# Patient Record
Sex: Female | Born: 1965
Health system: Southern US, Community
[De-identification: ages and names within clinical notes are randomized; demographics above are authoritative.]

## PROBLEM LIST (undated history)

## (undated) DIAGNOSIS — K589 Irritable bowel syndrome without diarrhea: Secondary | ICD-10-CM

## (undated) DIAGNOSIS — N281 Cyst of kidney, acquired: Secondary | ICD-10-CM

## (undated) HISTORY — PX: LITHOTRIPSY: SUR834

---

## 1997-12-30 ENCOUNTER — Encounter: Admission: RE | Admit: 1997-12-30 | Discharge: 1997-12-30 | Payer: Self-pay | Admitting: Family Medicine

## 1998-01-18 ENCOUNTER — Encounter: Admission: RE | Admit: 1998-01-18 | Discharge: 1998-01-18 | Payer: Self-pay | Admitting: Family Medicine

## 1998-01-29 ENCOUNTER — Ambulatory Visit (HOSPITAL_COMMUNITY): Admission: RE | Admit: 1998-01-29 | Discharge: 1998-01-29 | Payer: Self-pay | Admitting: *Deleted

## 1998-02-10 ENCOUNTER — Encounter: Admission: RE | Admit: 1998-02-10 | Discharge: 1998-02-10 | Payer: Self-pay | Admitting: Family Medicine

## 1998-04-02 ENCOUNTER — Other Ambulatory Visit: Admission: RE | Admit: 1998-04-02 | Discharge: 1998-04-02 | Payer: Self-pay | Admitting: Obstetrics & Gynecology

## 1998-04-13 ENCOUNTER — Encounter: Admission: RE | Admit: 1998-04-13 | Discharge: 1998-04-13 | Payer: Self-pay | Admitting: Family Medicine

## 1998-07-27 ENCOUNTER — Encounter: Admission: RE | Admit: 1998-07-27 | Discharge: 1998-07-27 | Payer: Self-pay | Admitting: Family Medicine

## 1998-10-05 ENCOUNTER — Encounter: Admission: RE | Admit: 1998-10-05 | Discharge: 1998-10-05 | Payer: Self-pay | Admitting: Sports Medicine

## 1998-11-25 ENCOUNTER — Encounter: Admission: RE | Admit: 1998-11-25 | Discharge: 1998-11-25 | Payer: Self-pay | Admitting: Family Medicine

## 1998-12-23 ENCOUNTER — Encounter: Admission: RE | Admit: 1998-12-23 | Discharge: 1998-12-23 | Payer: Self-pay | Admitting: Family Medicine

## 1999-03-10 ENCOUNTER — Encounter: Admission: RE | Admit: 1999-03-10 | Discharge: 1999-03-10 | Payer: Self-pay | Admitting: Family Medicine

## 1999-04-08 ENCOUNTER — Encounter: Admission: RE | Admit: 1999-04-08 | Discharge: 1999-04-08 | Payer: Self-pay | Admitting: Family Medicine

## 1999-04-15 ENCOUNTER — Other Ambulatory Visit: Admission: RE | Admit: 1999-04-15 | Discharge: 1999-04-15 | Payer: Self-pay | Admitting: Obstetrics and Gynecology

## 1999-05-03 ENCOUNTER — Encounter: Admission: RE | Admit: 1999-05-03 | Discharge: 1999-05-03 | Payer: Self-pay | Admitting: Sports Medicine

## 1999-07-06 ENCOUNTER — Encounter: Admission: RE | Admit: 1999-07-06 | Discharge: 1999-07-06 | Payer: Self-pay | Admitting: Family Medicine

## 1999-10-05 ENCOUNTER — Encounter: Admission: RE | Admit: 1999-10-05 | Discharge: 1999-10-05 | Payer: Self-pay | Admitting: Family Medicine

## 1999-11-09 ENCOUNTER — Encounter: Admission: RE | Admit: 1999-11-09 | Discharge: 1999-11-09 | Payer: Self-pay | Admitting: Family Medicine

## 1999-12-07 ENCOUNTER — Encounter: Admission: RE | Admit: 1999-12-07 | Discharge: 1999-12-07 | Payer: Self-pay | Admitting: Family Medicine

## 2000-01-04 ENCOUNTER — Encounter: Admission: RE | Admit: 2000-01-04 | Discharge: 2000-01-04 | Payer: Self-pay | Admitting: Family Medicine

## 2000-02-02 ENCOUNTER — Encounter: Admission: RE | Admit: 2000-02-02 | Discharge: 2000-02-02 | Payer: Self-pay | Admitting: Family Medicine

## 2000-03-14 ENCOUNTER — Encounter: Admission: RE | Admit: 2000-03-14 | Discharge: 2000-03-14 | Payer: Self-pay | Admitting: Family Medicine

## 2000-04-13 ENCOUNTER — Other Ambulatory Visit: Admission: RE | Admit: 2000-04-13 | Discharge: 2000-04-13 | Payer: Self-pay | Admitting: Obstetrics & Gynecology

## 2000-04-18 ENCOUNTER — Encounter: Admission: RE | Admit: 2000-04-18 | Discharge: 2000-04-18 | Payer: Self-pay | Admitting: Family Medicine

## 2000-05-16 ENCOUNTER — Encounter: Admission: RE | Admit: 2000-05-16 | Discharge: 2000-05-16 | Payer: Self-pay | Admitting: Family Medicine

## 2000-06-12 ENCOUNTER — Encounter: Admission: RE | Admit: 2000-06-12 | Discharge: 2000-06-12 | Payer: Self-pay | Admitting: Family Medicine

## 2000-07-11 ENCOUNTER — Encounter: Admission: RE | Admit: 2000-07-11 | Discharge: 2000-07-11 | Payer: Self-pay | Admitting: Family Medicine

## 2000-08-13 ENCOUNTER — Encounter: Admission: RE | Admit: 2000-08-13 | Discharge: 2000-08-13 | Payer: Self-pay | Admitting: Family Medicine

## 2000-10-09 ENCOUNTER — Encounter: Admission: RE | Admit: 2000-10-09 | Discharge: 2000-10-09 | Payer: Self-pay | Admitting: Family Medicine

## 2001-05-24 ENCOUNTER — Encounter: Admission: RE | Admit: 2001-05-24 | Discharge: 2001-05-24 | Payer: Self-pay | Admitting: Family Medicine

## 2001-09-11 ENCOUNTER — Encounter: Admission: RE | Admit: 2001-09-11 | Discharge: 2001-09-11 | Payer: Self-pay | Admitting: Family Medicine

## 2001-09-13 ENCOUNTER — Encounter: Admission: RE | Admit: 2001-09-13 | Discharge: 2001-09-13 | Payer: Self-pay | Admitting: Sports Medicine

## 2002-03-14 ENCOUNTER — Ambulatory Visit (HOSPITAL_COMMUNITY): Admission: RE | Admit: 2002-03-14 | Discharge: 2002-03-14 | Payer: Self-pay | Admitting: Family Medicine

## 2002-03-14 ENCOUNTER — Encounter: Payer: Self-pay | Admitting: Family Medicine

## 2002-04-18 ENCOUNTER — Encounter: Admission: RE | Admit: 2002-04-18 | Discharge: 2002-04-18 | Payer: Self-pay | Admitting: Sports Medicine

## 2002-04-25 ENCOUNTER — Inpatient Hospital Stay (HOSPITAL_COMMUNITY): Admission: RE | Admit: 2002-04-25 | Discharge: 2002-04-25 | Payer: Self-pay | Admitting: Neurosurgery

## 2002-04-25 ENCOUNTER — Encounter: Payer: Self-pay | Admitting: Neurosurgery

## 2002-04-25 ENCOUNTER — Encounter (INDEPENDENT_AMBULATORY_CARE_PROVIDER_SITE_OTHER): Payer: Self-pay | Admitting: *Deleted

## 2002-07-18 ENCOUNTER — Other Ambulatory Visit: Admission: RE | Admit: 2002-07-18 | Discharge: 2002-07-18 | Payer: Self-pay | Admitting: Obstetrics and Gynecology

## 2002-09-26 ENCOUNTER — Encounter: Admission: RE | Admit: 2002-09-26 | Discharge: 2002-09-26 | Payer: Self-pay | Admitting: Sports Medicine

## 2002-10-24 ENCOUNTER — Ambulatory Visit (HOSPITAL_COMMUNITY): Admission: RE | Admit: 2002-10-24 | Discharge: 2002-10-24 | Payer: Self-pay | Admitting: Gastroenterology

## 2002-10-24 ENCOUNTER — Encounter: Payer: Self-pay | Admitting: Gastroenterology

## 2003-03-20 ENCOUNTER — Encounter: Admission: RE | Admit: 2003-03-20 | Discharge: 2003-03-20 | Payer: Self-pay | Admitting: Sports Medicine

## 2003-04-24 ENCOUNTER — Encounter: Admission: RE | Admit: 2003-04-24 | Discharge: 2003-04-24 | Payer: Self-pay | Admitting: Family Medicine

## 2003-08-07 ENCOUNTER — Other Ambulatory Visit: Admission: RE | Admit: 2003-08-07 | Discharge: 2003-08-07 | Payer: Self-pay | Admitting: *Deleted

## 2003-11-13 ENCOUNTER — Encounter: Admission: RE | Admit: 2003-11-13 | Discharge: 2003-11-13 | Payer: Self-pay | Admitting: Sports Medicine

## 2005-06-23 ENCOUNTER — Encounter: Admission: RE | Admit: 2005-06-23 | Discharge: 2005-06-23 | Payer: Self-pay | Admitting: Family Medicine

## 2006-04-27 ENCOUNTER — Ambulatory Visit: Payer: Self-pay | Admitting: Sports Medicine

## 2006-05-04 ENCOUNTER — Encounter: Admission: RE | Admit: 2006-05-04 | Discharge: 2006-05-04 | Payer: Self-pay | Admitting: Sports Medicine

## 2006-12-31 ENCOUNTER — Telehealth: Payer: Self-pay | Admitting: *Deleted

## 2007-01-01 ENCOUNTER — Encounter: Payer: Self-pay | Admitting: Sports Medicine

## 2007-01-01 ENCOUNTER — Ambulatory Visit: Payer: Self-pay | Admitting: Family Medicine

## 2007-01-01 DIAGNOSIS — M2170 Unequal limb length (acquired), unspecified site: Secondary | ICD-10-CM | POA: Insufficient documentation

## 2007-01-01 DIAGNOSIS — M25559 Pain in unspecified hip: Secondary | ICD-10-CM | POA: Insufficient documentation

## 2007-01-01 DIAGNOSIS — F5000 Anorexia nervosa, unspecified: Secondary | ICD-10-CM | POA: Insufficient documentation

## 2007-01-02 ENCOUNTER — Encounter: Admission: RE | Admit: 2007-01-02 | Discharge: 2007-01-02 | Payer: Self-pay | Admitting: Sports Medicine

## 2007-01-06 ENCOUNTER — Encounter: Admission: RE | Admit: 2007-01-06 | Discharge: 2007-01-06 | Payer: Self-pay | Admitting: Sports Medicine

## 2007-01-18 ENCOUNTER — Ambulatory Visit: Payer: Self-pay | Admitting: Sports Medicine

## 2007-01-18 DIAGNOSIS — M779 Enthesopathy, unspecified: Secondary | ICD-10-CM | POA: Insufficient documentation

## 2007-02-08 ENCOUNTER — Ambulatory Visit: Payer: Self-pay | Admitting: Sports Medicine

## 2007-03-08 ENCOUNTER — Ambulatory Visit: Payer: Self-pay | Admitting: Sports Medicine

## 2007-04-12 ENCOUNTER — Ambulatory Visit: Payer: Self-pay | Admitting: Sports Medicine

## 2007-05-01 ENCOUNTER — Encounter: Payer: Self-pay | Admitting: Sports Medicine

## 2007-06-14 ENCOUNTER — Encounter: Payer: Self-pay | Admitting: Sports Medicine

## 2007-06-28 ENCOUNTER — Ambulatory Visit: Payer: Self-pay | Admitting: Sports Medicine

## 2007-06-28 DIAGNOSIS — M84376A Stress fracture, unspecified foot, initial encounter for fracture: Secondary | ICD-10-CM | POA: Insufficient documentation

## 2007-08-02 ENCOUNTER — Ambulatory Visit: Payer: Self-pay | Admitting: Sports Medicine

## 2007-09-06 ENCOUNTER — Ambulatory Visit: Payer: Self-pay | Admitting: Sports Medicine

## 2007-10-18 ENCOUNTER — Ambulatory Visit: Payer: Self-pay | Admitting: Sports Medicine

## 2008-01-24 ENCOUNTER — Telehealth (INDEPENDENT_AMBULATORY_CARE_PROVIDER_SITE_OTHER): Payer: Self-pay | Admitting: *Deleted

## 2008-01-31 ENCOUNTER — Encounter: Admission: RE | Admit: 2008-01-31 | Discharge: 2008-01-31 | Payer: Self-pay | Admitting: Sports Medicine

## 2008-01-31 ENCOUNTER — Ambulatory Visit: Payer: Self-pay | Admitting: Sports Medicine

## 2008-01-31 DIAGNOSIS — R519 Headache, unspecified: Secondary | ICD-10-CM | POA: Insufficient documentation

## 2008-01-31 DIAGNOSIS — R51 Headache: Secondary | ICD-10-CM | POA: Insufficient documentation

## 2008-01-31 DIAGNOSIS — J320 Chronic maxillary sinusitis: Secondary | ICD-10-CM | POA: Insufficient documentation

## 2008-09-22 ENCOUNTER — Encounter: Payer: Self-pay | Admitting: Sports Medicine

## 2008-10-02 ENCOUNTER — Ambulatory Visit (HOSPITAL_COMMUNITY): Admission: RE | Admit: 2008-10-02 | Discharge: 2008-10-02 | Payer: Self-pay | Admitting: Obstetrics

## 2008-11-30 ENCOUNTER — Ambulatory Visit: Payer: Self-pay | Admitting: Sports Medicine

## 2008-11-30 DIAGNOSIS — J209 Acute bronchitis, unspecified: Secondary | ICD-10-CM | POA: Insufficient documentation

## 2008-11-30 DIAGNOSIS — R079 Chest pain, unspecified: Secondary | ICD-10-CM | POA: Insufficient documentation

## 2008-12-03 ENCOUNTER — Telehealth (INDEPENDENT_AMBULATORY_CARE_PROVIDER_SITE_OTHER): Payer: Self-pay | Admitting: *Deleted

## 2009-08-25 ENCOUNTER — Encounter: Payer: Self-pay | Admitting: Sports Medicine

## 2009-08-30 ENCOUNTER — Encounter: Payer: Self-pay | Admitting: Sports Medicine

## 2010-02-18 ENCOUNTER — Ambulatory Visit: Payer: Self-pay | Admitting: Family Medicine

## 2010-02-18 DIAGNOSIS — M79609 Pain in unspecified limb: Secondary | ICD-10-CM | POA: Insufficient documentation

## 2010-02-22 ENCOUNTER — Telehealth: Payer: Self-pay | Admitting: Family Medicine

## 2010-06-06 ENCOUNTER — Ambulatory Visit: Payer: Self-pay | Admitting: Sports Medicine

## 2010-06-06 DIAGNOSIS — M76899 Other specified enthesopathies of unspecified lower limb, excluding foot: Secondary | ICD-10-CM | POA: Insufficient documentation

## 2010-07-14 ENCOUNTER — Ambulatory Visit: Payer: Self-pay | Admitting: Sports Medicine

## 2010-08-24 ENCOUNTER — Encounter: Payer: Self-pay | Admitting: Sports Medicine

## 2010-09-18 ENCOUNTER — Encounter: Payer: Self-pay | Admitting: Sports Medicine

## 2010-09-18 ENCOUNTER — Encounter: Payer: Self-pay | Admitting: *Deleted

## 2010-09-27 NOTE — Assessment & Plan Note (Signed)
Summary: fu  left hip pain [facility]   Vital Signs:  Patient Profile:   45 Years Old Female Pulse rate:   66 / minute BP sitting:   105 / 66  Vitals Entered By: Lillia Pauls CMA (February 08, 2007 11:50 AM)               Chief Complaint:  fu  left hip pain.  History of Present Illness: Sherry Gross is 3 weeks post rest for an enthesiopathy of left Hamstring. she is doing exercises and feels stable on 2 lbs swimming daily less pain with walking now not running and prefers swimming to bike or elliptical        Physical Exam  General:     alert and underweight appearing.  pleasant female in NAD Msk:     very mild TTP at insertion of HS tendons onto ischial tuberosity on LT good strength at 90 deg of knee flexion pain at insertion at 45 deg of knee flexion for resisted HS test less pain at 20 deg of knee flexion  gait is better with minimal walking limp but slight shortening of left stride can do lunge walk without pain  unable to do a single hop on left     Impression & Recommendations:  Problem # 1:  ENTHESOPATHY, SITE NOS (ICD-726.90) continue xtrain and rehab program for next 4 to 6 wks when this is less painful and she can hop wihtout pain - return for eval if stride OK will allow easy running at that point Orders: [facility]- Est Level  3 ([address])    Patient Instructions: 1)  Perform 3 sets of 15  of the following exercises 2)  2)  1) hamstring curls using 2 lbs ankle weights 3)  3)  2) hamstring swings using 2 lbs ankle weights 4)  4)  3) hamstring lunges using 2 lbs ankle weights 5)  5)  4)Also perform half squat and up slowly using 10 lb weights  6)  6)  5)four way straight leg lifts 7)  7)  foward, lateral, reverse, and on the left side going to the right 8)  8)  6)(running lunge) using 10 pound weight 9)  9)  7)elliptical, stationary bike, swimming 10)  Lunge walking as a test of healing 11)  simple hop test as the same after lunge walking is easy 12)  Use  elliptical as a test

## 2010-09-27 NOTE — Assessment & Plan Note (Signed)
Summary: pt will arrive at 8:45 seen for headaches/dlh   Vital Signs:  Patient Profile:   45 Years Old Female Pulse rate:   71 / minute BP sitting:   100 / 68  Vitals Entered By: Lillia Pauls CMA (January 31, 2008 8:54 AM)                 Chief Complaint:  headaches.  History of Present Illness: HX of headaches Hx of sinus headaches in 2007 confirmed with CT - related to swimming took midrin but this made her too drowsy  these were occassional until 2 to 3 months and now very frequent up to 8 of 10 days with pain of 8 out of 10 several days longest duration was 3 days left sided frontal and temporal lobe and into left neck and shoulder sometimes relieved with 2 advil  not sure if related to dentistry because seh does have them on weekends  sometimes pops neck but not a lot of neck pain hx  no fam hx of migraine/ no personal hx of this type of ha  other sxs are of nasal drip - uses Afrin daily 5 to 6 times facial pressure as well particularly left    Past Medical History:    anorexia with previous inpatient Rx    anemia    oligomenorhea        Multivatmin 1 once daily    vit C 1000 mg        Severe IBS for which she uses:    Alpha lipoic acid 100 mgm    Megazyme digestive enzyme    Zelnorm 6 mgm per day - from [place]    Cascara    Swiss Chriss  Past Surgical History:    Schwanoma Rt thigh 2003 - Dr Channing Mutters      Physical Exam  General:     underweight appearing.   alert nad well oriented Head:     Normocephalic and atraumatic without obvious abnormalities. No apparent alopecia or balding. Eyes:     No corneal or conjunctival inflammation noted. EOMI. Perrla. Funduscopic exam benign, without hemorrhages, exudates or papilledema. Vision grossly normal. Ears:     External ear exam shows no significant lesions or deformities.  Otoscopic examination reveals clear canals, tympanic membranes are intact bilaterally without bulging, retraction, inflammation or  discharge. Hearing is grossly normal bilaterally. Nose:     External nasal examination shows no deformity or inflammation. Nasal mucosa are pink and moist without lesions or exudates. Mouth:     Oral mucosa and oropharynx without lesions or exudates.  Teeth in good repair. Lungs:     Normal respiratory effort, chest expands symmetrically. Lungs are clear to auscultation, no crackles or wheezes. Heart:     Normal rate and regular rhythm. S1 and S2 normal without gallop, murmur, click, rub or other extra sounds.  rate is 50 Neurologic:     No cranial nerve deficits noted. Station and gait are normal. Plantar reflexes are down-going bilaterally. DTRs are symmetrical throughout. Sensory, motor and coordinative functions appear intact.    Impression & Recommendations:  Problem # 1:  HEADACHE (ICD-784.0)  Her updated medication list for this problem includes:    Tramadol Hcl 50 Mg Tabs (Tramadol hcl) .Marland Kitchen... 1 by mouth q 6hrs  Orders: CT without Contrast (CT w/o contrast) [facility]- Est Level  3 (99213) [facility]- Est  Level 4 (98119)  will check CT of sinuses and assess difficult for her to work with the  multiple headaches trial on tramadol in interim   Problem # 2:  CHRONIC MAXILLARY SINUSITIS (ICD-473.0) CT scan shows involvement of both sinuses suspect we need to make several changes will need nasal steroid wean afrin from chronic use use prolonged abx until clear Orders: [facility]- Est  Level 4 ([address])   coordinating care after per phone as discussed with patient and will use ABX and meds after she agrees Time 40 mins  Complete Medication List: 1)  Prilosec 20 Mg Cpdr (Omeprazole) .Marland Kitchen.. 1 by mouth qd 2)  Zantac 150 Mg Caps (Ranitidine hcl) .... 2 by mouth at hs 3)  Tramadol Hcl 50 Mg Tabs (Tramadol hcl) .Marland Kitchen.. 1 by mouth q 6hrs    Prescriptions: TRAMADOL HCL 50 MG  TABS (TRAMADOL HCL) 1 by mouth q 6hrs  #40 x 3   Entered and Authorized by:   Enid Baas MD   Signed by:   Enid Baas  MD on 01/31/2008   Method used:   Print then Give to Patient   RxID:   [id number]  ]  Appended Document: Med/Alg Import    Medications Added DOXYCYCLINE HYCLATE 100 MG TABS (DOXYCYCLINE HYCLATE) Take 1 tablet by mouth twice a day NASONEX 50 MCG/ACT  SUSP (MOMETASONE FUROATE) 1 to 2 puffs each nostril qd      Prescriptions: NASONEX 50 MCG/ACT  SUSP (MOMETASONE FUROATE) 1 to 2 puffs each nostril qd  #1 x 11   Entered and Authorized by:   Enid Baas MD   Signed by:   Enid Baas MD on 02/03/2008   Method used:   Electronically sent to ...       [facility]  [place] [address]       [address] [place] [address]       [place], [place]  [address]       Ph: [phone] or [phone]       Fax: [phone]   RxID:   [id number] DOXYCYCLINE HYCLATE 100 MG TABS (DOXYCYCLINE HYCLATE) Take 1 tablet by mouth twice a day  #60 x 1   Entered and Authorized by:   Enid Baas MD   Signed by:   Enid Baas MD on 02/03/2008   Method used:   Electronically sent to ...       [facility]  [place] [address]       [address]       [place], [place]  [address]       Ph: [phone] or [phone]       Fax: [phone]   RxID:   [id number]

## 2010-09-27 NOTE — Progress Notes (Signed)
Summary: WI request  Phone Note Call from Patient Call back at [phone]   Reason for Call: Talk to Nurse Summary of Call: pt states she hurt her hip this weekend when running and Dr Darrick Penna told her to come in this week to be seen by him - he is booked - needs to be seen on Friday Initial call taken by: Haydee Salter,  Dec 31, 2006 10:41 AM  Follow-up for Phone Call        Spoke with Dr. Jonni Sanger.  She can come in on Friday but Dr. Darrick Penna is booked up that morning.  I told her to I would check with Dr. Darrick Penna to see if he's be willling to work her in.  If not I would call her back to set up another time. Follow-up by: Dennison Nancy RN,  Dec 31, 2006 11:10 AM

## 2010-09-27 NOTE — Assessment & Plan Note (Signed)
Summary: F/U  DPG    Vital Signs:  Patient Profile:   45 Years Old Female Pulse rate:   70 / minute BP sitting:   103 / 59  Vitals Entered By: Lillia Pauls CMA (October 18, 2007 8:57 AM)                 Chief Complaint:  fu left hamstring pain.  History of Present Illness: Elverda comes in for followup of her L proximal hamstring tendinosis Sx significantly improved since last visit. Diligent about strengthening exercises prescribed. Still hurts when sitting on the affected area.   Back running 6 mi 5-7x/wk. Feels as though hamstring is "as good as it's going to get." Other recent injuries also on L side include calcaneal stress fx, has not been symptomatic since prior to last appt. Unable to tolerate custom orthotics 2/2 blister formation.        Physical Exam  General:     pleasant, very thin, WF in NAD Msk:     L LEG: mildly tender at ischial tuberosity and proximal hamstring tendon, just at insertion full ROM at hips good strength bilateral hamstrings at 90 and 30 degrees no pain with resisted flexion    Impression & Recommendations:  Problem # 1:  ENTHESOPATHY, SITE NOS (ICD-726.90) Assessment: Improved Proximal hamstring teninosis.   Pain and strength improved.  -Continue strengthening exercises. -Focus on recovery nutrition to include mostly carbs and some protein to replace energy as well as help build muscle. -Given multiple overuse injuries on L side, suspect leg length discrepancy playing a role.  Strongly suggested trying again to tolerate custom orthotics that incorporate leg length correction.  Gave advice regarding blister management. -Followup as needed. Orders: [facility]- Est Level  3 ([address])      ]

## 2010-09-27 NOTE — Assessment & Plan Note (Signed)
Summary: 9:00-HEEL SWELLING,[facility]   Vital Signs:  Patient profile:   45 year old female BP sitting:   109 / 74  Vitals Entered By: Lillia Pauls CMA (February 18, 2010 9:07 AM)  History of Present Illness: 4-5 days of steadily worsening posterior left heel pain. Hurts to walk or stand.  No specific injury---3 weeks ago did get new running shoes, Has used ice with little relief  Runs 30-40 miles per week--this is unchanged over mny years  PERTINENT PMH/PSH: Hx left heel stress fx hx prior problems with retreocalcaneal bursitis--she cannot remember which foot. 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   Msk:  Left posterio heel is warm, no erythema. TTP over bursa. Achilles is nontender and has no defect.  pulses 2+B= dorsalis pedis  sensation to soft touch B intact in feet.  Gait is antalgic Additional Exam:  [place] left posterior heel. Calcaneus bone itself looksnormal although there is some evidence of osteophytes on rim. The retricalcaneal bursa is well defined, very enlarged, no increase in vascularity is seen on doppler. Achilles tendon is normal in appearance and width.   Impression & Recommendations:  Problem # 1:  HEEL PAIN (ICD-729.5)  Orders: Cam Walker [phone]) [place] LIMITED [phone]) fairly severe retrocalcaneal bursitis air cast cam walker modified to supprt laterally without putting pressure on the posterior heel. Also 3/4 heel lift placed in cam walker will have her wear cam walker while walking or standing, no running. Ok swim. She does not like t bike. She will touch base with me on Monday and we can see how she is doing. Bursais quite inflamed at this point. I would want some improvement before I took her out  of boot much. She is Ok with this plan. Ice baths and voltaren gel  Complete Medication List: 1)  Prilosec 20 Mg Cpdr (Omeprazole) .Marland Kitchen.. 1 by mouth qd 2)  Zantac 150 Mg Caps  (Ranitidine hcl) .... 2 by mouth at hs 3)  Spironolactone 25 Mg Tabs (Spironolactone) .Marland Kitchen.. 1 by mouth bid 4)  Zithromax Z-pak 250 Mg Tabs (Azithromycin) .... Use as directed 5)  Voltaren 1 % Gel (Diclofenac sodium) .... Apply 2-4 g qid to left heel   disp 100 g tube Prescriptions: VOLTAREN 1 % GEL (DICLOFENAC SODIUM) apply 2-4 g qid to left heel   disp 100 g tube  #1 x 2   Entered and Authorized by:   Denny Levy MD   Signed by:   Denny Levy MD on 02/18/2010   Method used:   [facility]        [facility]  [place] [address] [facility]       [address] [place] [address]       [place], [place]  [address]       Ph: [phone] or [phone]       Fax: [phone]   RxID:   [id number]

## 2010-09-27 NOTE — Assessment & Plan Note (Signed)
Summary: HIP INJURY,[facility]   Vital Signs:  Patient profile:   45 year old female Pulse rate:   72 / minute BP sitting:   112 / 75  (right arm)  Vitals Entered By: Rochele Pages RN (June 06, 2010 12:04 PM) CC: lt hip pain x2 months   CC:  lt hip pain x2 months.  History of Present Illness: Patient presents to clinic for evaluation of Lt hip pain which she has been experiencing for the past 2 months.  She states the pain is deep within her left hip, and this morning the pain actually radiated down the back of her thigh for the first time.   She states the pain is similar to an old injury she experienced.   She does straight leg raises laterally, to the front and back wearing an 8lb weight- reps of 30. States she has done these daily for the past 2 years since her old injury.  Runs approximately 50 miles per week.  In 2008 she had partial tear of left HS tendons insertion by MRI This healed slowly w exercise  Physical Exam  General:  Well-developed,well-nourished,in no acute distress; alert,appropriate and cooperative throughout examination  thin Msk:  Pain at ischial tuberosity left HS insertion some pain in buttocks at piriformis but less good strength on hip abduction, adduction. rotation and extension norm hip ROM  resisted HS strength is dec 2/2 pain? this is in eccentric testing primarily Additional Exam:  MSK [place] calcifiction at HS tendon insertion to left tibial tuberosity numerous neovessels and inc doppler flow noted in this region large bursal swelling over tuberosity   Impression & Recommendations:  Problem # 1:  HIP PAIN (ICD-719.45)  Orders: Garment,belt,sleeve or other covering ,elastic or similar stretch (G2542) [place] LIMITED ([id number])   start back rehab exercises uses HS sleeve as much as comfortable  Problem # 2:  ENTHESOPATHY, SITE NOS (ICD-726.90)  would like to avoid cortisone w partial tear in 2008  will try NTG protocol  Orders: [place] LIMITED  ([id number])  Problem # 3:  BURSITIS, HIP (ICD-726.5)  May need to do directed [place] injection of this if pain is not improved for now cushion for seat Icing  reck 4 to 6 wks  Orders: [place] LIMITED ([address])  Complete Medication List: 1)  Prilosec 20 Mg Cpdr (Omeprazole) .Marland Kitchen.. 1 by mouth qd 2)  Zantac 150 Mg Caps (Ranitidine hcl) .... 2 by mouth at hs 3)  Spironolactone 25 Mg Tabs (Spironolactone) .Marland Kitchen.. 1 by mouth bid 4)  Zithromax Z-pak 250 Mg Tabs (Azithromycin) .... Use as directed 5)  Voltaren 1 % Gel (Diclofenac sodium) .... Apply 2-4 g qid to left heel   disp 100 g tube 6)  Nitroglycerin 0.2 Mg/hr Pt24 (Nitroglycerin) .... 1/4 patch to tendon as directed for 24 hours per day Prescriptions: NITROGLYCERIN 0.2 MG/HR PT24 (NITROGLYCERIN) 1/4 patch to tendon as directed for 24 hours per day  #30 x 1   Entered and Authorized by:   Enid Baas MD   Signed by:   Enid Baas MD on 06/06/2010   Method used:   [facility]        [facility]  [place] [address]       [address] [place] [address]       [place], [place]  [address]       Ph: [phone] or [phone]       Fax: [phone]   RxID:   [id number]

## 2010-09-27 NOTE — Assessment & Plan Note (Signed)
Summary: FOLLOW UP MRI OF HIP   Vital Signs:  Patient Profile:   45 Years Old Female Pulse rate:   60 / minute BP sitting:   104 / 64  Vitals Entered By: Lillia Pauls CMA (Jan 18, 2007 10:06 AM)               Chief Complaint:  fu mri.  History of Present Illness: Still reports pain at the inferior crease of her left gluteus.  Still has pain with hip extension and knee flexion.    Denies pain in the right lower back.    Mri report shows stress reaction at the left ishial tuberosity and partial tear of the semimembranosus insertion there.  Also shows stress reaction in the right superior sacrum.          Physical Exam  General:     Back shows mild scoliosis Leg length reveals left  ~ 1 cm longer than Rt Patient has pain with resisted knee flexion.  Pain is located in the middle body of the left hamstring.  Has improving ROM in her left hip with flexion to 90 degrees and extension to 15 degrees.  strength is good today on RT and mildly decreased on LT with some giving way second to pain  no defects noted in HS belly        Impression & Recommendations:  Problem # 1:  HIP PAIN (ICD-719.45) Assessment: Improved Likely due to semimembranosus tendonopathy and ischial stress reaction.  Will begin physical therapy as outlined in the instruction section.  Will likely need 6 weeks of rest from running to rehab the stress reaction.  This is significantly improved after 2 weeks of limited running and more crosstraining Orders: [facility]- Est Level  3 ([address])   Problem # 2:  ANOREXIA NERVOSA (ICD-307.1) Assessment: Unchanged Stable under team treatment Orders: [facility]- Est Level  3 (60454)   Problem # 3:  ENTHESOPATHY, SITE NOS (ICD-726.90) will treat as stress frx and wait a full 6 weeks to run 2 wks on TM and then progress to running  Orders: [facility]- Est Level  3 ([address]11)    Patient Instructions: 1)  Perform 3 sets of 15  of the following exercises 2)  1) hamstring  curls using 2 lbs ankle weights 3)  2) hamstring swings using 2 lbs ankle weights 4)  3) hamstring lunges using 2 lbs ankle weights 5)  4)Also perform half squat and up slowly using 10 lb weights  6)  5)four way straight leg lifts 7)  foward, lateral, reverse, and on the left side going to the right 8)  6)(running lunge) using 10 pound weight 9)  7)elliptical, stationary bike, swimming
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
## 2010-09-27 NOTE — Assessment & Plan Note (Signed)
Summary: 4:15 APPT,F/U HIP,[facility]   Vital Signs:  Patient profile:   45 year old female Pulse rate:   61 / minute BP sitting:   107 / 73  (right arm)  Vitals Entered By: Rochele Pages RN (July 14, 2010 4:55 PM)  History of Present Illness: Mahitha returns for injury left HS pain now over buttocks at times sharp w sitting in wrong chair no sciatic sxs doing exercises able to run 40 mpw only mild pain in HS  Physical Exam  General:   Thin - in no acute distress; alert,appropriate and cooperative throughout examination Msk:  mild TT deep palpation of left buttocks no TTP ischial tuberosity HS better overall strength still some weakness on isolating semimembranosus and tendinosis  good hip abduction and rotation stretngth  walking and jogging gaits without limp   Impression & Recommendations:  Problem # 1:  BURSITIS, HIP (ICD-726.5) less tender suspect bursal swelling is less if not resoved in 6 wks reck and scan  Problem # 2:  ENTHESOPATHY, SITE NOS (ICD-726.90) cont series of HS exercises  add some overstriding drills  cont to use HS comp sleeve indefinitely  Complete Medication List: 1)  Prilosec 20 Mg Cpdr (Omeprazole) .Marland Kitchen.. 1 by mouth qd 2)  Zantac 150 Mg Caps (Ranitidine hcl) .... 2 by mouth at hs 3)  Spironolactone 25 Mg Tabs (Spironolactone) .Marland Kitchen.. 1 by mouth bid 4)  Zithromax Z-pak 250 Mg Tabs (Azithromycin) .... Use as directed 5)  Voltaren 1 % Gel (Diclofenac sodium) .... Apply 2-4 g qid to left heel   disp 100 g tube 6)  Nitroglycerin 0.2 Mg/hr Pt24 (Nitroglycerin) .... 1/4 patch to tendon as directed for 24 hours per day   Orders Added: 1)  Est. Patient Level III [[address]109]

## 2010-09-27 NOTE — Progress Notes (Signed)
Summary: please call patient today   Phone Note Call from Patient Call back at Home Phone [phone]   Caller: Patient Summary of Call: Pt would like call today and can be reached at [phone] - regards to phone call from Dr Darrick Penna.  It has been over 1 week, to fit patient in for headaches.   Initial call taken by: Rae Roam,  Jan 24, 2008 2:39 PM  Follow-up for Phone Call        pt will see fields at given appt Follow-up by: Lillia Pauls CMA,  Jan 24, 2008 3:44 PM

## 2010-09-27 NOTE — Assessment & Plan Note (Signed)
Summary: TO SEE [name]/KH   Vital Signs:  Patient Profile:   45 Years Old Female BP sitting:   106 / 66  Pt. in pain?   yes    Location:   hip    Intensity:   5                Chief Complaint:  left hip pain.  History of Present Illness: February started with outside LT hip pain more over greater troch mileage was 35 per wk mostely treadmill running no changes before onset made her limp a  little but did not make her change  9 days ago after 5K made symptoms worse - still on outside  Half marathon this week made much worse and into groin, pelvis and buttocks LT leg felt numb after race from thigh down no back pain no trauma  Bone desnity report shows osteopenia with range of - 1.2 to -1.6 at hips  Unable to tolerate supplemental calcium but drinks OJ with Ca No supplemental vit D  Has Problems with GI systems - getting Zelnorm from [place] - now from [facility] - danielle meyer Pa and dr rengel;  Megazyme and alpha lipoic acid Prilosec and zantac  Regular menses past 5 months/  highly irregular past years and sometimes only 1 time per year    Past Medical History:    anorexia with previous inpatient Rx    anemia    oligomenorhea   Family History:    [name] is age 29 and doing well        [name] - husband is runner; cardiac workup recently Dr. [name] unremarkable - per dr Caryl Never  Social History:    Occupation: dentist    Married    Never Smoked   Risk Factors:  Tobacco use:  never    Physical Exam  General:     underweight appearing.  markedly thin.  alert and in NAD.   Msk:     Back shows mild scoliosis  Leg length reveals left  ~ 1 cm longer than Rt  Rt hip ROM shows 45 ER and 35 IR Lt hip ROM 15 IR then pain; 30 ER then pain that refers to buttocks and greater troch  flexion limited on lt by 15 to 20 deg strength good throughout    Impression & Recommendations:  Problem # 1:  HIP PAIN (ICD-719.45) Assessment: New Lt hip must r/o stress  fracture although piriformis syndrome remains possible with positive exam and sciatica symptoms AP pelvis and LT hip detail If negative go ahead with MRI no running pending dx Orders: [facility]- Est  Level 4 (16109)   Problem # 2:  LEG LEGNTH DISCREPANCY (ICD-736.81) Assessment: Unchanged we need to return to use of orthotics and use of leg length adjustment Orders: [facility]- Est  Level 4 (99214)   Problem # 3:  ANOREXIA NERVOSA (ICD-307.1) Assessment: Unchanged critical to remain in treatment program needs Vit D level known osteopenia - need approach to building more bone density periods better this year Orders: [facility]- Est  Level 4 (60454)       Vital Signs:  Patient Profile:   45 Years Old Female BP sitting:   106 / 66    Location:   hip    Intensity:   5                Appended Document: Orders Update Please schedule MRI of hip and pelvis - r/o femoral or pelvic area stress fx;  hip pain persistent for months.  hx of anorexia. r/o piriformis syndrome   Clinical Lists Changes  Orders: Added new Test order of MRI (MRI) - Signed

## 2010-09-27 NOTE — Assessment & Plan Note (Signed)
Summary: F/U VISIT/[facility]    Vital Signs:  Patient Profile:   45 Years Old Female Pulse rate:   56 / minute BP sitting:   105 / 57  Vitals Entered By: Lillia Pauls CMA (September 06, 2007 11:27 AM)                 Chief Complaint:  left hamstring pain x may.  History of Present Illness: 45yr old thin WF with h/o L tendiosis and likely partial tear of hamstring insertion who was in the process of rehabing it when she developed a L calcaneal stress fracture.  That has been healing and she recently started running every other day 6 miles Dec 22nd, along with swimming and eliptical.  She reports continued pain in her L hip unchanged since the original injury in May.  She reports doing daily strengthening exercises. Pain 3/10 when running but much more when sitting as she is a Education officer, community and sits all day.    Original MRI showed tendinosis and likely partial tearing at the origin of the left  hamstring musculature, particularly the semimembranosus, with associated reactive edema in the left ischial tuberosity.      Past Medical History:    Reviewed history from 01/01/2007 and no changes required:       anorexia with previous inpatient Rx       anemia       oligomenorhea   Family History:    [name] is age 18 and doing well        [name] - husband is runner; cardiac workup recently Dr. [name] unremarkable - per dr Caryl Never  Social History:    Reviewed history from 01/01/2007 and no changes required:       Occupation: dentist       Married       Never Smoked     Physical Exam  General:     alert.  very thin Head:     Normocephalic and atraumatic without obvious abnormalities. No apparent alopecia or balding. Msk:     L Hip: TTP over L hamstrip insertion of isch spine.  5/5 strength at knee flexion of 90 degree but diminished at 45degree and less. Decreased strength of Abduction but normal adduction strength on L. Normal hip flexion strength. Normal sensation.  Neg tinnels.  Normal R hip strength by comparison of same exam.  L ankle: No edema. Normal ROM. Normal strength dorsiflexion, plantarflexion, eversion, inversion. No ttp of calcaneus.  Neurologic:     alert & oriented X3 and gait normal, including with jogging.     Impression & Recommendations:  Problem # 1:  ENTHESOPATHY, SITE NOS (ICD-726.90) Assessment: Deteriorated Pt continues with pain parrticularly with sitting and now also with weakness. MRI results as in HPI.  Strengthening exercises as in instructions. FU 6 weeks.   Orders: [facility]- Est  Level 4 (56213)   Problem # 2:  STRESS FRACTURE, FOOT (ICD-733.94) Assessment: Improved Pt now pain free. Still, with h/o anorexia, will keep this in mind while rehab L hamstring. I.e. no hopping exercises.  Orders: [facility]- Est  Level 4 (08657)    Patient Instructions: 1)  Follow up with Dr Darrick Penna in 6 weeks. 2)  Ok to walk/run but careful when your form starts to break down.  3)  Do the following exercises as Dr Darrick Penna discussed (3 sets of 10): 4)  - Cone touches 5)  - 10lbs eccentric leg drops 6)  - On your stomach, hip extension with leg  flexion eccentric exercises 7)  - Lunges 8)  - Abduction (or lateral leg raises) exercises    ]

## 2010-09-27 NOTE — Assessment & Plan Note (Signed)
Summary: fu/el   Vital Signs:  Patient Profile:   45 Years Old Female Pulse rate:   79 / minute BP sitting:   108 / 70  Vitals Entered By: Lillia Pauls CMA (March 08, 2007 10:52 AM)               Chief Complaint:    fu  left hip pain.  History of Present Illness: Maysa returns and follow-up of her hamstring injury she has faithfully done the exercises she can tell a lot more strength she has not run she continues swimming and crosstraining        Physical Exam  General:     underweight appearing.  NAD Head:     Normocephalic and atraumatic without obvious abnormalities. No apparent alopecia or balding. Msk:     hamstring strength today seems almost equal  left versus right tenderness at the insertion of a hamstring into the ischial tuberosity on lt running gait reveals minimal l limp but she does favor her left side    Impression & Recommendations:  Problem # 1:  ENTHESOPATHY, SITE NOS (ICD-726.90) Assessment: Improved continued her hamstring exercises add gentle stretching begin a gradual running program for days a week starting at 20 minutes and adding five minutes per week walk run if any limp or tenderness  recheck when she reaches 40 minutes of running keep up the good protein intake Orders: [facility]- Est Level  3 (16109)

## 2010-09-27 NOTE — Assessment & Plan Note (Signed)
Summary: follow up/stress fracture/el    Vital Signs:  Patient Profile:   45 Years Old Female Pulse rate:   65 / minute BP sitting:   105 / 69  Vitals Entered By: Lillia Pauls CMA (June 28, 2007 9:03 AM)                 Chief Complaint:  FU/STRESS FX.  History of Present Illness: patient is now 2 weeks post injury to left heel. seen initially with question of arch and PF injury. then much more severe and post heel - felt to be calcaneal stress fx. calcaneal view does show some stress reaction in 2 areas of calcaneus.  Patient has a hx of a lot of running injuries to left leg. just resolved HS tear recently remote stress fx of MT hx of anorexia compensated but still difficult  at time of injury training for half marathon and mileage 35 per week pain came on standing in dentist offce but 1 weekprior heel had been struck with iron gate        Physical Exam  General:     underweight appearing.   NAD pleasant Head:     Normocephalic and atraumatic without obvious abnormalities. No apparent alopecia or balding. Msk:     marked tenderness over post heel on left pseudo leg length change secondary to ant tilt of left pelvis no swellin  does have moderate abductor weakness (inspite of exercise regimen); this is bilat.    Impression & Recommendations:  Problem # 1:  STRESS FRACTURE, FOOT (ICD-733.94) Assessment: New must wear comfortable cushion xtrain in pool/ bike and can try elliptical calorie intake increase by 100 per day re ck 3 to 4 weeks Orders: [facility]- Est Level  3 (16109)   Problem # 2:  ANOREXIA NERVOSA (ICD-307.1) Assessment: Unchanged discussed energy needs Orders: [facility]- Est Level  3 (60454)   Problem # 3:  LEG LEGNTH DISCREPANCY (ICD-736.81) continue with orthotics and cushion Orders: [facility]- Est Level  3 (09811)      ]

## 2010-09-27 NOTE — Assessment & Plan Note (Signed)
Summary: FU/KH    Vital Signs:  Patient Profile:   45 Years Old Female Pulse rate:   62 / minute BP sitting:   108 / 66  Vitals Entered By: NEETON                 Chief Complaint:  L GROIN PAIN X NOV 14 AND L TOP OF FOOT PAIN X 2 WKS.  History of Present Illness: patient returns for follow up of calcaneal stress fx no 7 weeks and is much less tender was stickling to elliptical however on Nov 14 was traveling hotel had TM so she ran 5 mi severe left groin pain after this now better but still sore heel is better but has been staying on elliptical or swimming  left Hamstring enthesiopathy is doing better but still aches when she does too much  doing her exercises for leg strength  not taking calcium or vit d/ does get multivit Has GI probs eating disorder issues        Physical Exam  General:     alert.  very thin Head:     Normocephalic and atraumatic without obvious abnormalities. No apparent alopecia or balding. Msk:     slight tenderness at insertion of HS on left to ischial tuberosity strengrth now good on abduction, HS concentric and eccentric testing  left adductor is tender to testing/ not that weak but antalgic lt hip ROM nl 95 deg on rotation and good flecion hip loading does not bring out pain Extremities:     calcaneal squeeze test is slightly tender on left on medial and lateral side    Impression & Recommendations:  Problem # 1:  STRESS FRACTURE, FOOT (ICD-733.94) Assessment: Improved I think this is healing but still needs a few weeks really good to get more calcium intake Orders: [facility]- Est Level  3 (16109)   Problem # 2:  HIP PAIN (ICD-719.45) Assessment: New left seems more like adductor strain i do worry about stress fx with hx of anorexia and prior stress fractures will follow closely Orders: [facility]- Est Level  3 (60454)   Problem # 3:  ENTHESOPATHY, SITE NOS (ICD-726.90) Assessment: Improved lt hamstring definitely doing  better Orders: [facility]- Est Level  3 (09811)    Patient Instructions: 1)  keep up exercises 2)  vanilla viactin 3)  2 more weeks of pure elliptical 4)  2 weeks of alternating TM and elliptical 5)  re ck in 4 weeks    ]

## 2010-09-27 NOTE — Consult Note (Signed)
Summary: [facility]   Imported By: Alphonzo Lemmings KIVETT 06/18/2007 13:50:52  _____________________________________________________________________  External Attachment:    Type:   Image     Comment:   External Document

## 2010-09-27 NOTE — Consult Note (Signed)
Summary: [facility] Consultation Note  [facility] Consultation Note   Imported By: Haydee Salter 05/02/2007 14:26:53  _____________________________________________________________________  External Attachment:    Type:   Image     Comment:   External Document

## 2010-09-27 NOTE — Assessment & Plan Note (Signed)
Summary: fu/kh    Vital Signs:  Patient Profile:   45 Years Old Female Pulse rate:   71 / minute BP sitting:   107 / 73  Vitals Entered By: Lillia Pauls CMA (April 12, 2007 11:58 AM)               Chief Complaint:  FU L HIP PAIN.  History of Present Illness: 45 year old female here for hip f/u.  Reports that at times her hip is uncomfortable at times especially sitting for long periods of time.  Not having pain on running.   She is running four days a week and abt six miles each day on the run. Has had 6/10 pain on sitting and 2/10 on running.  Continues to do the hip exercises that were prescribed.  Feels that her pain is overall better than it was in her last visit.        Physical Exam  General:     underweight appearing.  NAD Head:     Normocephalic and atraumatic without obvious abnormalities. No apparent alopecia or balding. Msk:     hamstring strength today seems almost equal  left versus right tenderness at the insertion of a hamstring into the ischial tuberosity on the right leg and the left seems to be none tender. internal and external rotation of the hip seems to be wnl. Leg lengths unqual, left leg is shorter than the right. Hop test with a short distance b/l, equal distance some weakness still present.    Impression & Recommendations:  Problem # 1:  ENTHESOPATHY, SITE NOS (ICD-726.90) Assessment: Improved Pt has improved with her pain level. She is to continue her current exercise program from before She can increase her mileage gradually as tolerated but no big increases in mileage. Follow-up on as needed basis  Orders: [facility]- Est Level  3 (13086)   Problem # 2:  LEG LEGNTH DISCREPANCY (ICD-736.81)  Orders: [facility]- Est Level  3 (57846)  try to work into some use of orthotics blisters have been problem to date

## 2010-09-27 NOTE — Progress Notes (Signed)
----   Converted from flag ---- ---- 02/22/2010 1:44 PM, Marily Memos wrote: Pt states you asked her to call regarding her heel.  She said it's better, and is wondering if the boot can come off.  PT Contact [phone], [phone] cell. ------------------------------  spoke w Dr Cosens--80% better. Will OK coming out of boot in evening--maybe wear at work next couple of days--will let her try running some and see how it goes--with any increase in pain she returns to boot for a period. She is comfortable w this plan

## 2010-09-27 NOTE — Consult Note (Signed)
Summary: [facility]- Office Note  [facility]- Office Note   Imported By: Clydell Hakim 08/26/2009 11:26:08  _____________________________________________________________________  External Attachment:    Type:   Image     Comment:   External Document

## 2010-11-11 ENCOUNTER — Other Ambulatory Visit (HOSPITAL_COMMUNITY): Payer: Self-pay | Admitting: Obstetrics

## 2010-11-11 DIAGNOSIS — M8580 Other specified disorders of bone density and structure, unspecified site: Secondary | ICD-10-CM

## 2010-11-11 DIAGNOSIS — Z1231 Encounter for screening mammogram for malignant neoplasm of breast: Secondary | ICD-10-CM

## 2010-11-25 ENCOUNTER — Ambulatory Visit (HOSPITAL_COMMUNITY)
Admission: RE | Admit: 2010-11-25 | Discharge: 2010-11-25 | Disposition: A | Discharge status: Home / Self Care | Payer: BC Managed Care – PPO | Source: Ambulatory Visit | Attending: Obstetrics | Admitting: Obstetrics

## 2010-11-25 DIAGNOSIS — Z1231 Encounter for screening mammogram for malignant neoplasm of breast: Secondary | ICD-10-CM | POA: Insufficient documentation

## 2010-11-25 DIAGNOSIS — Z13820 Encounter for screening for osteoporosis: Secondary | ICD-10-CM | POA: Insufficient documentation

## 2010-11-25 DIAGNOSIS — M8580 Other specified disorders of bone density and structure, unspecified site: Secondary | ICD-10-CM

## 2011-01-13 NOTE — Op Note (Signed)
   NAME:  Sherry Gross, Sherry Gross                        ACCOUNT NO.:  [id number]   MEDICAL RECORD NO.:  [id number]                   PATIENT TYPE:  INP   LOCATION:  3013                                 FACILITY:  [facility]   PHYSICIAN:  Payton Doughty, M.D.                   DATE OF BIRTH:  [birth year]   DATE OF PROCEDURE:  04/25/2002  DATE OF DISCHARGE:  04/25/2002                                 OPERATIVE REPORT   OPERATION:  Resection of right thigh mass.   SURGEON:  Payton Doughty, M.D.   ANESTHESIA:  General LMA.   COMPLICATIONS:  None.   ASSISTANT:  None.   PROCEDURE:  This is a 45 year old, right-handed white girl with a palpable  mass in her right thigh.  She was taken to the operating room and had LMA  anesthesia induced.  Following this, shaved, prepped and draped in the usual  sterile fashion.  The skin was infiltrated with 1% lidocaine with 1:400,000  epinephrine, and the skin was incised for approximately 3 cm directly over  the palpable mass.  The fascial plane was obtained, and the vastus medialis  fascia was divided.  Muscle-splitting retraction was undertaken, and  immediately obvious was a mass which was densely encapsulated.  It was  easily dissected free from the surrounding muscle.  There was no attachment  of this mass to any nerves.  It was resected in toto and inspection of the  bed revealed no mass remaining.  It was passed off as a single specimen.  One attachment point was also sent as specimen.  Meticulous hemostasis was  assured.  Very little muscle cutting actually was undertaken to resect this;  it was virtually all muscle splitting.  The wound was irrigated, hemostasis  was assured.  Muscle fascia was reapproximated with 3-0 Vicryl in  interrupted fashion. Subcutaneous tissue was reapproximated with 3-0 Vicryl  in interrupted fashion.  The skin was closed with 4-0 Vicryl in a running  subcuticular fashion.  Benzoin and Steri-Strips were placed and made  occlusive  with Telfa and OpSite.  The patient then returned to the recovery  room in good condition.                                               Payton Doughty, M.D.    MWR/MEDQ  D:  04/25/2002  T:  04/29/2002  Job:  [id number]

## 2011-01-13 NOTE — H&P (Signed)
NAME:  Sherry Gross, Sherry Gross                        ACCOUNT NO.:  [id number]   MEDICAL RECORD NO.:  [id number]                   PATIENT TYPE:  INP   LOCATION:  3013                                 FACILITY:  [facility]   PHYSICIAN:  Payton Doughty, M.D.                   DATE OF BIRTH:  [birth year]   DATE OF ADMISSION:  04/25/2002  DATE OF DISCHARGE:  04/25/2002                                HISTORY & PHYSICAL   ADMISSION DIAGNOSIS:  Probable neurofibroma in the right leg.   HISTORY OF PRESENT ILLNESS:  The patient is a 45 year old right handed white  female who in November had leg pain. Did not think much about it. It seems  to resolve. Has noticed it for about four months now, a lump in her right  thigh about mid way between the inguinal ligament. Her knee on the medial  aspect is a bit tender and sartorius episodically. MRI demonstrated this and  she has come to me for evaluation.   PAST MEDICAL HISTORY:  Remarkable for irritable bowel syndrome and she has  also had one kidney stone.   MEDICATIONS:  1. Murelax 75 mg per day.  2. Colace 100 mg twice a day.  3. Lipuric acid 100 mg twice a day.  4. Dexloxiglumide (which is a study dose) three times a day.  5. Woman's vitamin.  6. Lexapro 5 mg per day for irritable bowel syndrome.   ALLERGIES:  PENICILLIN, CEPHALOSPORINS, AND IVP DYE.   FAMILY HISTORY:  Father is deceased of nasopharyngeal carcinoma. Mother is  71 years of ago and in good health with colitis.   SOCIAL HISTORY:  She does not smoke or drink. 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 She is an avid runner.   REVIEW OF SYSTEMS:  Remarkable for indigestion, abdominal pain, and leg  pain.   PHYSICAL EXAMINATION:  HEENT: Examination is within normal limits.  NECK: Reasonable range of motion.  CHEST: Clear.  CARDIAC: Regular rate and rhythm with functional bradycardia.  ABDOMEN: Nontender with no hepatosplenomegaly.  EXTREMITIES: No clubbing, cyanosis, or  edema. Peripheral pulses are good.  The mass is located on the medial aspect of the right thigh, just under the  edge of the sartorius muscle but appears somewhat deep to it. On the MRI, it  appears to be located between the vastus medialis and vastus intermedius  muscle.  GU: Examination deferred.  NEURO: Awake, alert, and oriented. Cranial nerves are intact. Motor  examination 5/5 strength throughout the upper and lower extremities. She  does not describe a sensory deficit.   IMPRESSION:  Probable neurofibroma that needs to be resected.   PLAN:  I plan on doing the procedure. The risks and benefits of this  approach have been discussed with her and she wishes to proceed.  Payton Doughty, M.D.    MWR/MEDQ  D:  04/25/2002  T:  04/28/2002  Job:  [id number]

## 2011-01-24 ENCOUNTER — Other Ambulatory Visit: Payer: Self-pay | Admitting: Family Medicine

## 2011-01-24 DIAGNOSIS — M545 Low back pain, unspecified: Secondary | ICD-10-CM

## 2011-01-28 ENCOUNTER — Ambulatory Visit
Admission: RE | Admit: 2011-01-28 | Discharge: 2011-01-28 | Disposition: A | Discharge status: Home / Self Care | Payer: BC Managed Care – PPO | Source: Ambulatory Visit | Attending: Family Medicine | Admitting: Family Medicine

## 2011-01-28 DIAGNOSIS — M545 Low back pain, unspecified: Secondary | ICD-10-CM

## 2011-05-16 ENCOUNTER — Other Ambulatory Visit: Payer: Self-pay | Admitting: Family Medicine

## 2011-05-16 DIAGNOSIS — M25561 Pain in right knee: Secondary | ICD-10-CM

## 2011-05-20 ENCOUNTER — Ambulatory Visit
Admission: RE | Admit: 2011-05-20 | Discharge: 2011-05-20 | Disposition: A | Discharge status: Home / Self Care | Payer: BC Managed Care – PPO | Source: Ambulatory Visit | Attending: Family Medicine | Admitting: Family Medicine

## 2011-05-20 DIAGNOSIS — M25561 Pain in right knee: Secondary | ICD-10-CM

## 2011-05-20 MED ORDER — GADOBENATE DIMEGLUMINE 529 MG/ML IV SOLN
10.0000 mL | Freq: Once | INTRAVENOUS | Status: AC | PRN
  Administered 2011-05-20: 10 mL via INTRAVENOUS

## 2011-10-13 ENCOUNTER — Other Ambulatory Visit (HOSPITAL_COMMUNITY): Payer: Self-pay | Admitting: Obstetrics

## 2011-10-13 DIAGNOSIS — Z1231 Encounter for screening mammogram for malignant neoplasm of breast: Secondary | ICD-10-CM

## 2011-11-10 ENCOUNTER — Ambulatory Visit (HOSPITAL_COMMUNITY)
Admission: RE | Admit: 2011-11-10 | Discharge: 2011-11-10 | Disposition: A | Discharge status: Home / Self Care | Payer: BC Managed Care – PPO | Source: Ambulatory Visit | Attending: Obstetrics | Admitting: Obstetrics

## 2011-11-10 DIAGNOSIS — Z1231 Encounter for screening mammogram for malignant neoplasm of breast: Secondary | ICD-10-CM | POA: Insufficient documentation

## 2012-05-24 ENCOUNTER — Other Ambulatory Visit: Payer: Self-pay | Admitting: Family Medicine

## 2012-05-24 DIAGNOSIS — M542 Cervicalgia: Secondary | ICD-10-CM

## 2012-05-26 ENCOUNTER — Other Ambulatory Visit: Payer: BC Managed Care – PPO

## 2012-05-26 ENCOUNTER — Inpatient Hospital Stay: Admission: RE | Admit: 2012-05-26 | Payer: BC Managed Care – PPO | Source: Ambulatory Visit

## 2012-05-30 ENCOUNTER — Other Ambulatory Visit: Payer: Self-pay | Admitting: Family Medicine

## 2012-05-30 DIAGNOSIS — M549 Dorsalgia, unspecified: Secondary | ICD-10-CM

## 2012-05-30 DIAGNOSIS — M542 Cervicalgia: Secondary | ICD-10-CM

## 2012-05-31 ENCOUNTER — Ambulatory Visit
Admission: RE | Admit: 2012-05-31 | Discharge: 2012-05-31 | Disposition: A | Discharge status: Home / Self Care | Payer: BC Managed Care – PPO | Source: Ambulatory Visit | Attending: Family Medicine | Admitting: Family Medicine

## 2012-05-31 ENCOUNTER — Other Ambulatory Visit: Payer: BC Managed Care – PPO

## 2012-05-31 DIAGNOSIS — M549 Dorsalgia, unspecified: Secondary | ICD-10-CM

## 2012-05-31 DIAGNOSIS — M542 Cervicalgia: Secondary | ICD-10-CM

## 2012-10-04 ENCOUNTER — Other Ambulatory Visit (HOSPITAL_COMMUNITY): Payer: Self-pay | Admitting: Obstetrics

## 2012-10-04 DIAGNOSIS — Z1231 Encounter for screening mammogram for malignant neoplasm of breast: Secondary | ICD-10-CM

## 2012-11-01 ENCOUNTER — Ambulatory Visit (HOSPITAL_COMMUNITY): Payer: BC Managed Care – PPO

## 2012-11-01 ENCOUNTER — Ambulatory Visit (HOSPITAL_COMMUNITY)
Admission: RE | Admit: 2012-11-01 | Discharge: 2012-11-01 | Disposition: A | Discharge status: Home / Self Care | Payer: BC Managed Care – PPO | Source: Ambulatory Visit | Attending: Obstetrics | Admitting: Obstetrics

## 2012-11-01 DIAGNOSIS — Z1231 Encounter for screening mammogram for malignant neoplasm of breast: Secondary | ICD-10-CM

## 2013-09-05 ENCOUNTER — Other Ambulatory Visit (HOSPITAL_COMMUNITY): Payer: Self-pay | Admitting: Obstetrics

## 2013-09-05 DIAGNOSIS — Z1231 Encounter for screening mammogram for malignant neoplasm of breast: Secondary | ICD-10-CM

## 2013-10-10 ENCOUNTER — Ambulatory Visit: Payer: BC Managed Care – PPO | Attending: Gastroenterology | Admitting: Physical Therapy

## 2013-10-10 DIAGNOSIS — M2420 Disorder of ligament, unspecified site: Secondary | ICD-10-CM | POA: Insufficient documentation

## 2013-10-10 DIAGNOSIS — IMO0001 Reserved for inherently not codable concepts without codable children: Secondary | ICD-10-CM | POA: Insufficient documentation

## 2013-10-10 DIAGNOSIS — M629 Disorder of muscle, unspecified: Secondary | ICD-10-CM | POA: Insufficient documentation

## 2013-10-17 ENCOUNTER — Ambulatory Visit: Payer: BC Managed Care – PPO | Admitting: Physical Therapy

## 2013-10-17 ENCOUNTER — Ambulatory Visit (HOSPITAL_COMMUNITY)
Admission: RE | Admit: 2013-10-17 | Discharge: 2013-10-17 | Disposition: A | Discharge status: Home / Self Care | Payer: BC Managed Care – PPO | Source: Ambulatory Visit | Attending: Obstetrics | Admitting: Obstetrics

## 2013-10-17 ENCOUNTER — Ambulatory Visit (HOSPITAL_COMMUNITY): Payer: BC Managed Care – PPO

## 2013-10-17 DIAGNOSIS — Z1231 Encounter for screening mammogram for malignant neoplasm of breast: Secondary | ICD-10-CM

## 2013-10-31 ENCOUNTER — Ambulatory Visit: Payer: BC Managed Care – PPO | Attending: Gastroenterology | Admitting: Physical Therapy

## 2013-10-31 DIAGNOSIS — IMO0001 Reserved for inherently not codable concepts without codable children: Secondary | ICD-10-CM | POA: Insufficient documentation

## 2013-10-31 DIAGNOSIS — M629 Disorder of muscle, unspecified: Secondary | ICD-10-CM | POA: Insufficient documentation

## 2013-10-31 DIAGNOSIS — M2420 Disorder of ligament, unspecified site: Secondary | ICD-10-CM | POA: Insufficient documentation

## 2013-11-07 ENCOUNTER — Ambulatory Visit: Payer: BC Managed Care – PPO | Admitting: Physical Therapy

## 2013-11-15 ENCOUNTER — Emergency Department (HOSPITAL_COMMUNITY): Payer: BC Managed Care – PPO

## 2013-11-15 ENCOUNTER — Encounter (HOSPITAL_COMMUNITY): Payer: Self-pay | Admitting: Emergency Medicine

## 2013-11-15 ENCOUNTER — Emergency Department (HOSPITAL_COMMUNITY)
Admission: EM | Admit: 2013-11-15 | Discharge: 2013-11-15 | Disposition: A | Discharge status: Home / Self Care | Payer: BC Managed Care – PPO | Attending: Emergency Medicine | Admitting: Emergency Medicine

## 2013-11-15 DIAGNOSIS — Z8719 Personal history of other diseases of the digestive system: Secondary | ICD-10-CM | POA: Insufficient documentation

## 2013-11-15 DIAGNOSIS — Z87448 Personal history of other diseases of urinary system: Secondary | ICD-10-CM | POA: Insufficient documentation

## 2013-11-15 DIAGNOSIS — R143 Flatulence: Secondary | ICD-10-CM

## 2013-11-15 DIAGNOSIS — Z880 Allergy status to penicillin: Secondary | ICD-10-CM | POA: Insufficient documentation

## 2013-11-15 DIAGNOSIS — R142 Eructation: Secondary | ICD-10-CM | POA: Insufficient documentation

## 2013-11-15 DIAGNOSIS — R141 Gas pain: Secondary | ICD-10-CM | POA: Insufficient documentation

## 2013-11-15 DIAGNOSIS — Z79899 Other long term (current) drug therapy: Secondary | ICD-10-CM | POA: Insufficient documentation

## 2013-11-15 DIAGNOSIS — M79609 Pain in unspecified limb: Secondary | ICD-10-CM | POA: Insufficient documentation

## 2013-11-15 DIAGNOSIS — R109 Unspecified abdominal pain: Secondary | ICD-10-CM | POA: Insufficient documentation

## 2013-11-15 HISTORY — DX: Irritable bowel syndrome, unspecified: K58.9

## 2013-11-15 HISTORY — DX: Cyst of kidney, acquired: N28.1

## 2013-11-15 LAB — CBC WITH DIFFERENTIAL/PLATELET
Basophils Absolute: 0.1 10*3/uL (ref 0.0–0.1)
Basophils Relative: 2 % — ABNORMAL HIGH (ref 0–1)
Eosinophils Absolute: 0 10*3/uL (ref 0.0–0.7)
Eosinophils Relative: 1 % (ref 0–5)
HCT: 32.8 % — ABNORMAL LOW (ref 36.0–46.0)
Hemoglobin: 11.6 g/dL — ABNORMAL LOW (ref 12.0–15.0)
Lymphocytes Relative: 43 % (ref 12–46)
Lymphs Abs: 1.2 10*3/uL (ref 0.7–4.0)
MCH: 30.1 pg (ref 26.0–34.0)
MCHC: 35.4 g/dL (ref 30.0–36.0)
MCV: 85.2 fL (ref 78.0–100.0)
Monocytes Absolute: 0.4 10*3/uL (ref 0.1–1.0)
Monocytes Relative: 16 % — ABNORMAL HIGH (ref 3–12)
Neutro Abs: 1.1 10*3/uL — ABNORMAL LOW (ref 1.7–7.7)
Neutrophils Relative %: 38 % — ABNORMAL LOW (ref 43–77)
Platelets: 243 10*3/uL (ref 150–400)
RBC: 3.85 MIL/uL — ABNORMAL LOW (ref 3.87–5.11)
RDW: 13.1 % (ref 11.5–15.5)
WBC: 2.8 10*3/uL — ABNORMAL LOW (ref 4.0–10.5)

## 2013-11-15 LAB — COMPREHENSIVE METABOLIC PANEL
ALT: 25 U/L (ref 0–35)
AST: 25 U/L (ref 0–37)
Albumin: 3.7 g/dL (ref 3.5–5.2)
Alkaline Phosphatase: 68 U/L (ref 39–117)
BUN: 19 mg/dL (ref 6–23)
CO2: 24 mEq/L (ref 19–32)
Calcium: 8.8 mg/dL (ref 8.4–10.5)
Chloride: 103 mEq/L (ref 96–112)
Creatinine, Ser: 0.85 mg/dL (ref 0.50–1.10)
GFR calc Af Amer: >90 mL/min (ref >90)
GFR calc non Af Amer: 82 mL/min — ABNORMAL LOW (ref >90)
Glucose, Bld: 87 mg/dL (ref 70–99)
Potassium: 2.9 mEq/L — CL (ref 3.7–5.3)
Sodium: 141 mEq/L (ref 137–147)
Total Bilirubin: 0.3 mg/dL (ref 0.3–1.2)
Total Protein: 6.7 g/dL (ref 6.0–8.3)

## 2013-11-15 LAB — URINE MICROSCOPIC-ADD ON

## 2013-11-15 LAB — URINALYSIS, ROUTINE W REFLEX MICROSCOPIC
Bilirubin Urine: NEGATIVE
Glucose, UA: NEGATIVE mg/dL
Ketones, ur: 15 mg/dL — AB
Nitrite: NEGATIVE
Protein, ur: 30 mg/dL — AB
Specific Gravity, Urine: 1.037 — ABNORMAL HIGH (ref 1.005–1.030)
Urobilinogen, UA: 0.2 mg/dL (ref 0.0–1.0)
pH: 5.5 (ref 5.0–8.0)

## 2013-11-15 MED ORDER — METOCLOPRAMIDE HCL 5 MG/ML IJ SOLN
10.0000 mg | Freq: Once | INTRAMUSCULAR | Status: AC
  Administered 2013-11-15: 10 mg via INTRAVENOUS
  Filled 2013-11-15: qty 2

## 2013-11-15 MED ORDER — POTASSIUM CHLORIDE CRYS ER 20 MEQ PO TBCR
40.0000 meq | EXTENDED_RELEASE_TABLET | Freq: Once | ORAL | Status: AC
  Administered 2013-11-15: 40 meq via ORAL
  Filled 2013-11-15: qty 2

## 2013-11-15 MED ORDER — SODIUM CHLORIDE 0.9 % IV BOLUS (SEPSIS)
1000.0000 mL | Freq: Once | INTRAVENOUS | Status: AC
  Administered 2013-11-15: 1000 mL via INTRAVENOUS

## 2013-11-15 MED ORDER — SIMETHICONE 40 MG/0.6ML PO SUSP (UNIT DOSE)
40.0000 mg | Freq: Once | ORAL | Status: AC
  Administered 2013-11-15: 40 mg via ORAL
  Filled 2013-11-15: qty 0.6

## 2013-11-15 NOTE — Discharge Instructions (Signed)
Please read and follow all provided instructions.  Your diagnoses today include:  1. Abdominal pain     Tests performed today include:  Blood counts and electrolytes - slightly low white blood cell count, low potassium  Blood tests to check liver and kidney function  Urine test to look for infection - mild dehydration  Vital signs. See below for your results today.   Medications prescribed:   None  Take any prescribed medications only as directed.  Home care instructions:   Follow any educational materials contained in this packet.  Follow-up instructions: Please follow-up with your primary care provider in the next 3 days for further evaluation of your symptoms. If you do not have a primary care doctor -- see below for referral information.   Return instructions:  SEEK IMMEDIATE MEDICAL ATTENTION IF:  The pain does not go away or becomes severe   A temperature above 101F develops   Repeated vomiting occurs (multiple episodes)   The pain becomes localized to portions of the abdomen. The right side could possibly be appendicitis. In an adult, the left lower portion of the abdomen could be colitis or diverticulitis.   Blood is being passed in stools or vomit (bright red or black tarry stools)   You develop chest pain, difficulty breathing, dizziness or fainting, or become confused, poorly responsive, or inconsolable (young children)  If you have any other emergent concerns regarding your health  Additional Information: Abdominal (belly) pain can be caused by many things. Your caregiver performed an examination and possibly ordered blood/urine tests and imaging (CT scan, x-rays, ultrasound). Many cases can be observed and treated at home after initial evaluation in the emergency department. Even though you are being discharged home, abdominal pain can be unpredictable. Therefore, you need a repeated exam if your pain does not resolve, returns, or worsens. Most patients  with abdominal pain don't have to be admitted to the hospital or have surgery, but serious problems like appendicitis and gallbladder attacks can start out as nonspecific pain. Many abdominal conditions cannot be diagnosed in one visit, so follow-up evaluations are very important.  Your vital signs today were: BP 103/66   Pulse 56   Temp(Src) 97.9 F (36.6 C) (Oral)   Resp 20   SpO2 100% If your blood pressure (bp) was elevated above 135/85 this visit, please have this repeated by your doctor within one month. --------------

## 2013-11-15 NOTE — ED Notes (Signed)
Low potassium reported from the lab

## 2013-11-15 NOTE — ED Provider Notes (Signed)
CSN: [id number]     Arrival date & time 11/15/13  1914 History   First MD Initiated Contact with Patient 11/15/13 2115     Chief Complaint  Patient presents with  . Emesis     (Consider location/radiation/quality/duration/timing/severity/associated sxs/prior Treatment) HPI Comments: Patient with history of IBS, no history of abdominal surgeries presents with complaint of nausea, feeling bloated for 4 days. Other than bloated feeling she has not had any abdominal pain. She had one episode of vomiting tonight while pressing on her abdomen. She vomited the food that she had for breakfast 10 hours prior. Patient had fever to 102 on the initial day of her illness however fever has not returned. Patient has had diarrhea but she states that medications that she takes for her IBS typically do this. She has not noted blood in her stool. No urinary symptoms. Patient states that she has leg cramps. The onset of this condition was acute. The course is constant. Aggravating factors: none. Alleviating factors: none.    The history is provided by the patient.    Past Medical History  Diagnosis Date  . IBS (irritable bowel syndrome)   . Renal cyst    Past Surgical History  Procedure Laterality Date  . Lithotripsy     No family history on file. History  Substance Use Topics  . Smoking status: Never Smoker   . Smokeless tobacco: Not on file  . Alcohol Use: No   OB History   Grav Para Term Preterm Abortions TAB SAB Ect Mult Living                 Review of Systems  Constitutional: Positive for fever (resolved).  HENT: Negative for rhinorrhea and sore throat.   Eyes: Negative for redness.  Respiratory: Negative for cough.   Cardiovascular: Negative for chest pain.  Gastrointestinal: Positive for nausea and vomiting. Negative for abdominal pain and diarrhea.  Genitourinary: Negative for dysuria.  Musculoskeletal: Positive for myalgias (legs).  Skin: Negative for rash.  Neurological:  Negative for headaches.    Allergies  Cephalosporins and Penicillins  Home Medications   Current Outpatient Rx  Name  Route  Sig  Dispense  Refill  . Cascara [place] 450 MG CAPS   Oral   Take 1 capsule by mouth 2 (two) times daily.         . Multiple Vitamin (MULTIVITAMIN WITH MINERALS) TABS tablet   Oral   Take 1 tablet by mouth daily.         Marland Kitchen. omeprazole (PRILOSEC) 20 MG capsule   Oral   Take 20 mg by mouth 2 (two) times daily before a meal.         . OVER THE COUNTER MEDICATION   Oral   Take 1 capsule by mouth 2 (two) times daily.         Marland Kitchen. OVER THE COUNTER MEDICATION   Oral   Take 1 tablet by mouth 2 (two) times daily. "swiss kris"         . Prenatal Vit-Fe Fumarate-FA (PRENATAL MULTIVITAMIN) TABS tablet   Oral   Take 1 tablet by mouth daily at 12 noon.         Marland Kitchen. PRESCRIPTION MEDICATION   Oral   Take 2 mg by mouth daily. "reselar" canadian medication         . ranitidine (ZANTAC) 150 MG tablet   Oral   Take 300 mg by mouth daily.         .Marland Kitchen  spironolactone (ALDACTONE) 50 MG tablet   Oral   Take 50 mg by mouth daily.          BP 114/75  Pulse 60  Temp(Src) 97.9 F (36.6 C) (Oral)  Resp 20  SpO2 100%  Physical Exam  Nursing note and vitals reviewed. Constitutional: She appears well-developed and well-nourished.  HENT:  Head: Normocephalic and atraumatic.  Eyes: Conjunctivae are normal. Right eye exhibits no discharge. Left eye exhibits no discharge.  Neck: Normal range of motion. Neck supple.  Cardiovascular: Normal rate, regular rhythm and normal heart sounds.   No murmur heard. Pulmonary/Chest: Effort normal and breath sounds normal. No respiratory distress. She has no wheezes. She has no rales.  Abdominal: Soft. Bowel sounds are increased. There is no tenderness. There is no rigidity, no rebound, no guarding, no CVA tenderness, no tenderness at McBurney's point and negative Murphy's sign.  Neurological: She is alert.  Skin: Skin  is warm and dry.  Psychiatric: She has a normal mood and affect.    ED Course  Procedures (including critical care time) Labs Review Labs Reviewed  CBC WITH DIFFERENTIAL - Abnormal; Notable for the following:    WBC 2.8 (*)    RBC 3.85 (*)    Hemoglobin 11.6 (*)    HCT 32.8 (*)    Neutrophils Relative % 38 (*)    Monocytes Relative 16 (*)    Basophils Relative 2 (*)    Neutro Abs 1.1 (*)    All other components within normal limits  COMPREHENSIVE METABOLIC PANEL - Abnormal; Notable for the following:    Potassium 2.9 (*)    GFR calc non Af Amer 82 (*)    All other components within normal limits  URINALYSIS, ROUTINE W REFLEX MICROSCOPIC - Abnormal; Notable for the following:    Color, Urine AMBER (*)    APPearance CLOUDY (*)    Specific Gravity, Urine 1.037 (*)    Hgb urine dipstick SMALL (*)    Ketones, ur 15 (*)    Protein, ur 30 (*)    Leukocytes, UA TRACE (*)    All other components within normal limits  URINE MICROSCOPIC-ADD ON - Abnormal; Notable for the following:    Bacteria, UA FEW (*)    All other components within normal limits   Imaging Review Dg Abd Acute W/chest  11/15/2013   CLINICAL DATA:  Current history of irritable bowel syndrome. Generalized abdominal pain.  EXAM: ACUTE ABDOMEN SERIES (ABDOMEN 2 VIEW & CHEST 1 VIEW)  COMPARISON:  CT abdomen 08/03/2006.  FINDINGS: Bowel gas pattern unremarkable without evidence of obstruction or significant ileus. No evidence of free air or significant air-fluid levels on the erect image. Expected stool burden in the colon. Opaque ingested material within the stomach and proximal jejunum in the left upper quadrant. Phleboliths in the left side of the pelvis. No visible opaque urinary tract calculi. Visible psoas margins. Regional skeleton intact.  Cardiomediastinal silhouette unremarkable. Lungs clear. Bronchovascular markings normal. Pulmonary vascularity normal. No visible pleural effusions. No pneumothorax.  IMPRESSION: No  acute abdominal or pulmonary abnormality.   Electronically Signed   By: Hulan Saas M.D.   On: 11/15/2013 23:04     EKG Interpretation None      9:50 PM Patient seen and examined. Work-up initiated. Medications ordered.   Vital signs reviewed and are as follows: Filed Vitals:   11/15/13 2115  BP: 114/75  Pulse: 60  Temp:   Resp:   BP 114/75  Pulse 60  Temp(Src) 97.9 F (36.6 C) (Oral)  Resp 20  SpO2 100%  Reviewed labs. She has not had low WBC count in past.   11:36 PM Patient feels slightly improved. Still continues to have pressure in her abdomen.  Informed of x-ray results.  She states that she is comfortable with discharge home with return if worsening. She does not want any pain medication and nausea medication.  The patient was urged to return to the Emergency Department immediately with worsening of current symptoms, worsening abdominal pain, persistent vomiting, blood noted in stools, fever, or any other concerns. The patient verbalized understanding.     MDM   Final diagnoses:  Abdominal pain   Patient with history of irritable bowel syndrome, with generalized abdominal pressure and minimal vomiting since being febrile on Tuesday. No recent fever. Suspect the patient has mild ileus from possible gastroenteritis. Her abdomen is soft and nontender on exam. Mild dehydration on UA without infection. Mild leukopenia, patient informed. Oral potassium given in ED. Acute abdomen negative for acute process.  Patient appears mildly uncomfortable but otherwise well. Feel that she is safe for discharge home with primary care followup. Return instructions given.  Renne Crigler, PA-C 11/15/13 2339

## 2013-11-15 NOTE — ED Notes (Signed)
Pt states vomiting since Tuesday with a fever on Tuesday.  Pt states similar symptoms with family members and a "lactic acid" feeling in her legs.

## 2013-11-16 NOTE — ED Provider Notes (Signed)
Medical screening examination/treatment/procedure(s) were performed by non-physician practitioner and as supervising physician I was immediately available for consultation/collaboration.  Stein Windhorst L Jenniefer Salak, MD 11/16/13 0108 

## 2013-11-21 ENCOUNTER — Ambulatory Visit: Payer: BC Managed Care – PPO | Admitting: Physical Therapy

## 2013-11-26 ENCOUNTER — Encounter: Payer: BC Managed Care – PPO | Admitting: Physical Therapy

## 2013-12-01 ENCOUNTER — Other Ambulatory Visit: Payer: Self-pay | Admitting: Gastroenterology

## 2013-12-01 DIAGNOSIS — R1032 Left lower quadrant pain: Secondary | ICD-10-CM

## 2013-12-19 ENCOUNTER — Ambulatory Visit: Payer: BC Managed Care – PPO | Admitting: Physical Therapy

## 2013-12-19 ENCOUNTER — Ambulatory Visit
Admission: RE | Admit: 2013-12-19 | Discharge: 2013-12-19 | Disposition: A | Discharge status: Home / Self Care | Payer: BC Managed Care – PPO | Source: Ambulatory Visit | Attending: Gastroenterology | Admitting: Gastroenterology

## 2013-12-19 DIAGNOSIS — R1032 Left lower quadrant pain: Secondary | ICD-10-CM

## 2013-12-19 MED ORDER — IOHEXOL 300 MG/ML  SOLN
100.0000 mL | Freq: Once | INTRAMUSCULAR | Status: AC | PRN
  Administered 2013-12-19: 100 mL via INTRAVENOUS

## 2014-04-07 ENCOUNTER — Other Ambulatory Visit: Payer: Self-pay | Admitting: Orthopedic Surgery

## 2014-04-07 DIAGNOSIS — M25561 Pain in right knee: Secondary | ICD-10-CM

## 2014-04-19 ENCOUNTER — Ambulatory Visit
Admission: RE | Admit: 2014-04-19 | Discharge: 2014-04-19 | Disposition: A | Discharge status: Home / Self Care | Payer: BC Managed Care – PPO | Source: Ambulatory Visit | Attending: Orthopedic Surgery | Admitting: Orthopedic Surgery

## 2014-04-19 DIAGNOSIS — M25561 Pain in right knee: Secondary | ICD-10-CM

## 2014-04-23 ENCOUNTER — Other Ambulatory Visit: Payer: BC Managed Care – PPO

## 2014-06-05 ENCOUNTER — Encounter: Payer: Self-pay | Admitting: Podiatrist

## 2014-06-05 ENCOUNTER — Ambulatory Visit (INDEPENDENT_AMBULATORY_CARE_PROVIDER_SITE_OTHER): Payer: BC Managed Care – PPO

## 2014-06-05 ENCOUNTER — Ambulatory Visit (INDEPENDENT_AMBULATORY_CARE_PROVIDER_SITE_OTHER): Payer: BC Managed Care – PPO | Admitting: Podiatrist

## 2014-06-05 DIAGNOSIS — M722 Plantar fascial fibromatosis: Secondary | ICD-10-CM

## 2014-06-05 DIAGNOSIS — M8438XA Stress fracture, other site, initial encounter for fracture: Secondary | ICD-10-CM

## 2014-06-05 DIAGNOSIS — M84376A Stress fracture, unspecified foot, initial encounter for fracture: Secondary | ICD-10-CM

## 2014-06-05 NOTE — Patient Instructions (Addendum)
Stress Fracture When too much stress is put on the foot, as may occur in running and jumping sports, the lengthy shafts of the bones of the forefoot become susceptible to breaking due to repetitive stress (stress fracture) because of thinness of these bone. A stress fracture is more common if osteoporosis is present or if inadequate athletic footwear is used. Shoes should be used which adequately support the sole of the foot to absorb the shocks of the activity participated in. Stress fractures are very common in competitive female runners who develop these small cracks on the surface of the bones in their legs and feet. The women most likely to suffer these injuries are those who restrict food intake and those who have irregular periods. Stress fractures usually start out as a minor discomfort in the foot or leg. The completion of fracture due to repetitive loading often occurs near the end of a long run. The pain may dissipate with rest. With the next exercise session, the pain may return earlier in the run. If an athlete notices that it hurts to touch just one spot on a bone and then stops running for a week, he or she may be tempted to return to running too soon. Often the pain is ignored in order to continue with high impact exercise. A stress fracture then develops. The athlete now has to avoid the hard pounding of running, but can ride a bike or swim for exercise once the pain has resolved with normal weight bearing until the fracture heals in 6-12 weeks. The most common sites for stress fractures are the bones in the front of the feet (metatarsals) and the long bone of the lower leg (tibia), but running can cause stress fractures anywhere in the lower extremities or pelvis. DIAGNOSIS  Usually the diagnosis is made by reviewing the patient's history. The bone involved progressively becomes more painful with activities. X-rays may show no break within the first 2-3 weeks that pain begins. A later X-ray may  show signs that the bone is healing. Having a bone scan or MRI usually makes an earlier diagnosis possible. HOME CARE INSTRUCTIONS  Treatment may include a cast or walking shoe.  High impact activities should be stopped until advised by your caregiver.  Wear shoes with adequate shock absorbing abilities and good support of the sole of the foot. This is especially important in the arch of the foot.  Alternative exercise may be undertaken while waiting for healing. This may include bicycling and swimming. If you do not have a cast or splint:  You may walk on your injured foot as tolerated or advised.  Do not put any weight on your injured foot until instructed. Slowly increase the amount of time you walk on the foot as the pain allows or as advised.  Use crutches until you can bear weight without pain. A gradual increase in weight bearing may help.  Apply ice to the injured area for the first 2 days after you have been treated or as directed by your caregiver.  Put ice in a plastic bag.  Place a towel between your skin and the bag.  Leave the ice on for 15-20 minutes at a time, every hour while you are awake.  Only take over-the-counter or prescription medicines for pain or discomfort as directed by your caregiver.  If your caregiver has given you a follow-up appointment, it is very important to keep that appointment. Not keeping the appointment could result in a chronic or permanent   injury, pain, and disability. SEEK IMMEDIATE MEDICAL CARE IF:   Pain is becoming worse rather than better.  Pain is uncontrolled with medicine.  You have increased swelling or redness in the foot.  The feeling in the foot or leg is diminished. MAKE SURE YOU:   Understand these instructions.  Will watch your condition.  Will get help right away if you are not doing well or get worse. Document Released: 11/04/2002 Document Revised: 12/09/2012 Document Reviewed: 03/30/2008 [employer] Patient  Information 2015 [employer]. This information is not intended to replace advice given to you by your health care provider. Make sure you discuss any questions you have with your health care provider.  

## 2014-06-09 NOTE — Progress Notes (Signed)
Chief Complaint  Patient presents with  . Foot Pain    plantar heel right   "Now my right heel hurts"     HPI: Patient is 48 y.o. female who presents today for pain in the right heel that started suddenly 2 weeks ago.  She has pain with the first step in the morning that also gets worse as the day goes on.  She previously had a fractured left heel which boot therapy was beneficial. She is concerned she might have the same problem on the right foot.   Physical Exam  Patient is awake, alert, and oriented x 3.  In no acute distress.  Vascular status is intact with palpable pedal pulses at 2/4 DP and PT bilateral and capillary refill time within normal limits. Neurological sensation is also intact bilaterally via Semmes Weinstein monofilament at 5/5 sites. Light touch, vibratory sensation, Achilles tendon reflex is intact. Dermatological exam reveals skin color, turger and texture as normal. No open lesions present.  Musculature intact with dorsiflexion, plantarflexion, inversion, eversion.  Pain in the central aspect of the right heel is noted. Pain at the insertion of the plantar fascia medially is also noted.  xrays concerning for a stress fracture on the plantar surface of the calcaneus  Assessment: stress fracture versus plantar fasciitis  Plan: Recommended boot therapy and a boot was dispensed for her use today. She will be seen back in 2 weeks and we will decide if an injection would be beneficial. At this time I would recommend boot therapy for up to 6 weeks.  We'll reevaluate at the next visit.

## 2014-06-12 ENCOUNTER — Ambulatory Visit: Payer: Self-pay | Admitting: Podiatrist

## 2014-06-17 ENCOUNTER — Telehealth: Payer: Self-pay | Admitting: *Deleted

## 2014-06-17 NOTE — Telephone Encounter (Signed)
Your office had called me because they needed to reschedule my appointment with Dr. Irving ShowsEgerton this coming Friday.  I was kind of waiting for that appointment because she put me in a boot that I cannot wear because it hurts worse to wear the boot than not to wear the boot.  Anyway, the receptionist thought I should leave you a message on the nurse's voicemail.  Now I can't be seen until 07/10/2014.  I don't know, I'm just icing it and wearing regular shoes but I cannot wear the boot.  It actually hurts to have it on never the less walking on it.  So if you would call and let me know what I should be doing, I'd appreciate it.

## 2014-06-18 ENCOUNTER — Telehealth: Payer: Self-pay | Admitting: Podiatrist

## 2014-06-18 NOTE — Telephone Encounter (Signed)
Shanda BumpsJessica, I'm sure you already took care of Sherry CheMargaret, but wanted to send you her information.  She would like tomorrow at 2:45 if possible. Thank you!

## 2014-06-18 NOTE — Telephone Encounter (Signed)
Sherry Gross called stating her right foot is still in pain. She has tried to wear the air fracture walker however she is unable to do so with her foot being so painful.  She was scheduled to see me on Friday however I had to reschedule this appoitment.  I discussed options of ordering an MRI of the foot.  Also discussed an injection into the plantar fascia to see if this would be helpful.  Discussed the potential for slowing down healing if a fracture is present.   Her insurance just re set therefore she would like to avoid the cost of a MRI if possible.  She will be set up to see Dr. Ardelle AntonWagoner  to have a steroid injection which I have recommended and we discussed in detail.  Hopefully she will then be able to tolerate the boot-- I would recommend she continue to wear the boot for 2 more weeks then I will follow up with her at that time.

## 2014-06-18 NOTE — Telephone Encounter (Signed)
Can you call Sherry Gross and see if she can come in today after work--I think she's going to need an injection if it actually hurts more to be in the boot.

## 2014-06-18 NOTE — Telephone Encounter (Signed)
Sherry LeschJessica S. called the patient to schedule her an appointment.

## 2014-06-19 ENCOUNTER — Ambulatory Visit (INDEPENDENT_AMBULATORY_CARE_PROVIDER_SITE_OTHER): Payer: BC Managed Care – PPO | Admitting: Podiatry

## 2014-06-19 ENCOUNTER — Ambulatory Visit: Payer: BC Managed Care – PPO | Admitting: Podiatrist

## 2014-06-19 ENCOUNTER — Encounter: Payer: Self-pay | Admitting: Podiatry

## 2014-06-19 VITALS — BP 120/65 | HR 63 | Resp 16

## 2014-06-19 DIAGNOSIS — M722 Plantar fascial fibromatosis: Secondary | ICD-10-CM

## 2014-06-19 MED ORDER — TRIAMCINOLONE ACETONIDE 10 MG/ML IJ SUSP: 10.0000 mg | Freq: Once | INTRAMUSCULAR | Status: AC

## 2014-06-19 NOTE — Telephone Encounter (Signed)
Scheduled patient to come in today @ 2:30 pm

## 2014-06-22 NOTE — Progress Notes (Signed)
Patient ID: Clancy GourdMargaret R Trotter, female   DOB: Dec 26, 1965, 48 y.o.   MRN: [id number]  Subjective: Ms. Jonni SangerSzott,  48 year old female, presents to the office today for complaints of continued right heel pain. 2. Still immobilized in a cam boot however it seemed to make the symptoms worsen. She has discussed an injection with Dr. Irving ShowsEgerton previously. At this time the patient is requesting a steroid injection to see if this helps alleviate her symptoms. She denies any recent injury or trauma to the area. She is an avid runner who runs a treadmill. She does not want to get an MRI at this time due to financial constraints. No other complaints this time.  Objective: AAO x3, NAD DP/PT pulses palpable bilaterally, CRT less than 3 seconds Protective sensation intact with Simms Weinstein monofilament, vibratory sensation intact, Achilles tendon reflex intact Tenderness palpation of the plantar medial tubercle of the right heel the insertion of the plantar fascia. There is no pain with lateral compression of the calcaneus or pain with vibratory sensation. There is no pain on the posterior aspect on the course of the tendon. No pain on the course the plantar fascial in the arch of the foot. Calf pain, swelling, warmth, erythema  Assessment: 48 year old female with right foot plantar fasciitis versus possible stress fracture.  Plan: -Conservative versus surgical treatment discussed including alternatives, risks, complications. -At this time patient does not want to get an MRI and she is requesting a steroid injection. Again discussed with her that if this is a stress fracture this can impede her healing and make the fracture worse. She understands this and wishes to proceed with the injection. Under sterile conditions a total of 2.5 mL of a mixture of Kenalog 10, 0.5% Marcaine plain and 2% lidocaine plain was infiltrated the point of maximal tenderness at the plantar fascia. Tolerated the injection well without  complications. -Continue ice to the affected area. -Stretching exercises discussed. -Discussed the importance of supportive shoe gear. -Follow-up in 4 weeks with Dr. Irving ShowsEgerton or sooner if any problems arise or any change in symptoms. Symptoms worsened or increased pain, directed to call the office immediately. Call if any questions, concerns.

## 2014-07-10 ENCOUNTER — Encounter: Payer: Self-pay | Admitting: Podiatrist

## 2014-07-10 ENCOUNTER — Ambulatory Visit (INDEPENDENT_AMBULATORY_CARE_PROVIDER_SITE_OTHER): Payer: BC Managed Care – PPO | Admitting: Podiatrist

## 2014-07-10 ENCOUNTER — Ambulatory Visit: Payer: BC Managed Care – PPO | Admitting: Podiatrist

## 2014-07-10 VITALS — BP 100/67 | HR 64 | Resp 12

## 2014-07-10 DIAGNOSIS — M722 Plantar fascial fibromatosis: Secondary | ICD-10-CM

## 2014-07-12 NOTE — Progress Notes (Signed)
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 She relates the last injection given by Dr. Ardelle AntonWagoner was helpful but she is now having discomfort on the top of her foot.  She has been wearing her post op boot as instructed but states her foot began to improve so she stopped wearing it all the time.  Objective:  Neurovascular status intact.  Moderate tenderness upon palpation plantar medial heel right is noted consistent with plantar fasciitis.  Discomfort on the dorsal lateral foot is noted likely from compensation.    Assessment:  Plantar fasciitis- dorsal lateral compensation  Plan;  Injected the heel for injection number 2 with kenalog and marcaine plain under sterile technique.  A plantar fascial taping was applied and she was scanned for new orthotics as she is a runner and has run every day in her previous pair and relates they are wearing out.  She will be notified when the inserts are ready for pick up.

## 2014-08-07 ENCOUNTER — Other Ambulatory Visit: Payer: BC Managed Care – PPO

## 2014-08-07 ENCOUNTER — Encounter: Payer: Self-pay | Admitting: Podiatrist

## 2014-08-31 ENCOUNTER — Other Ambulatory Visit: Payer: Self-pay | Admitting: Orthopedic Surgery

## 2014-08-31 DIAGNOSIS — M79671 Pain in right foot: Secondary | ICD-10-CM

## 2014-09-10 ENCOUNTER — Ambulatory Visit
Admission: RE | Admit: 2014-09-10 | Discharge: 2014-09-10 | Disposition: A | Discharge status: Home / Self Care | Payer: BLUE CROSS/BLUE SHIELD | Source: Ambulatory Visit | Attending: Orthopedic Surgery | Admitting: Orthopedic Surgery

## 2014-09-10 DIAGNOSIS — M79671 Pain in right foot: Secondary | ICD-10-CM

## 2014-09-11 ENCOUNTER — Other Ambulatory Visit (HOSPITAL_COMMUNITY): Payer: Self-pay | Admitting: Obstetrics

## 2014-09-11 DIAGNOSIS — Z1231 Encounter for screening mammogram for malignant neoplasm of breast: Secondary | ICD-10-CM

## 2014-10-12 ENCOUNTER — Ambulatory Visit (HOSPITAL_COMMUNITY)
Admission: RE | Admit: 2014-10-12 | Discharge: 2014-10-12 | Disposition: A | Discharge status: Home / Self Care | Payer: BLUE CROSS/BLUE SHIELD | Source: Ambulatory Visit | Attending: Obstetrics | Admitting: Obstetrics

## 2014-10-12 DIAGNOSIS — Z1231 Encounter for screening mammogram for malignant neoplasm of breast: Secondary | ICD-10-CM | POA: Diagnosis not present

## 2015-09-02 ENCOUNTER — Other Ambulatory Visit: Payer: Self-pay | Admitting: Orthopaedic Surgery

## 2015-09-02 DIAGNOSIS — M545 Low back pain: Secondary | ICD-10-CM

## 2016-02-12 IMAGING — MR MR KNEE*R* W/O CM
4 of 6 series · 13 of 40 positions shown · non-contrast
Comparison: Right knee MRI 05/20/2011.

CLINICAL DATA: Intermittent knee pain for 3 months. Runner without
acute injury. Question arthritis.

EXAM:
MRI OF THE RIGHT KNEE WITHOUT CONTRAST
TECHNIQUE: Multiplanar, multisequence MR imaging of the knee was performed. No
intravenous contrast was administered.

[Series 3: PD fat-sat · axial · 3.5mm · 0.20mm/px · z∈[-58,+43]mm · 4 of 25 slices shown (1 of 3)]
[im 1/25]
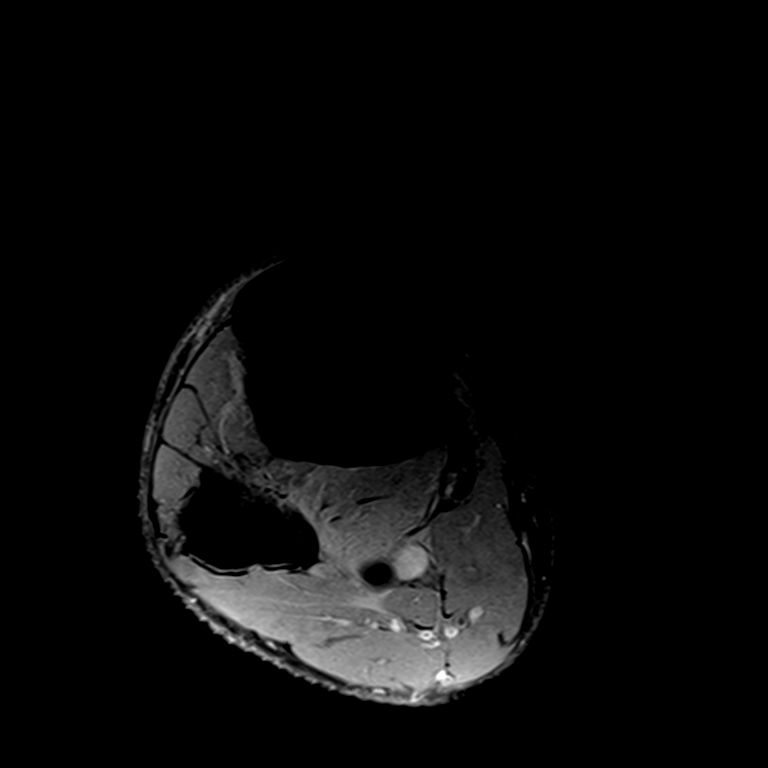
[im 5/25]
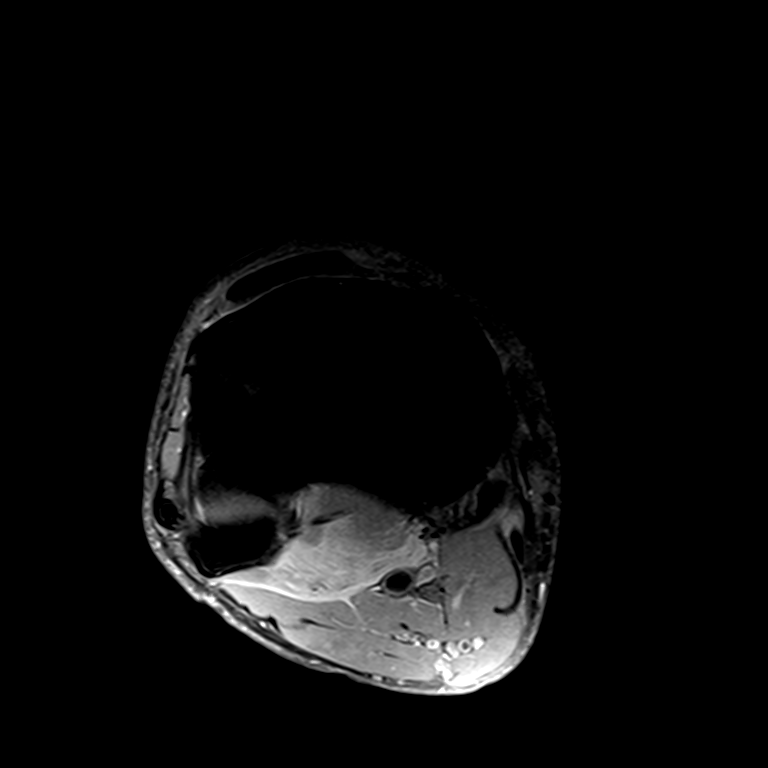
[im 15/25]
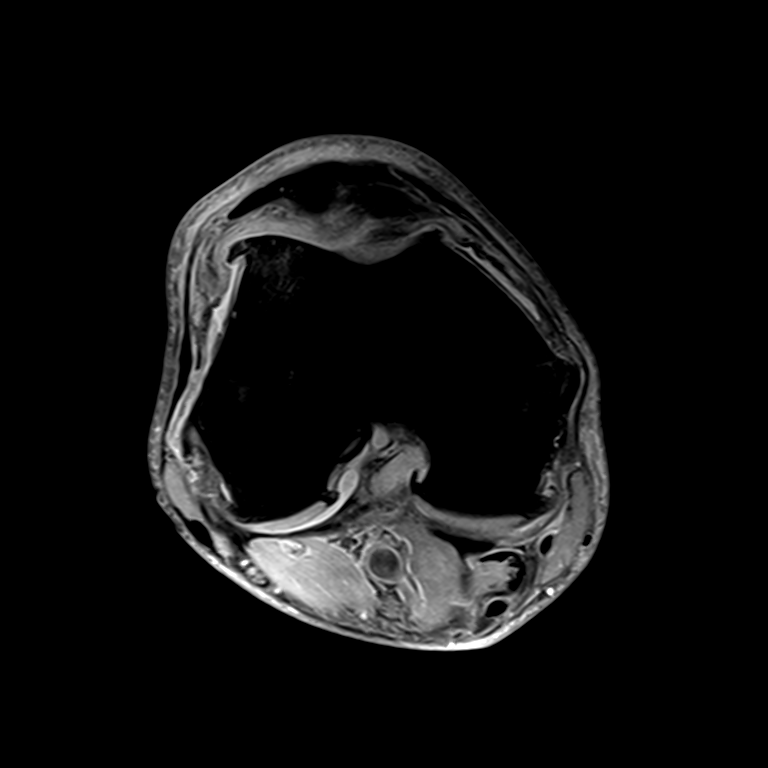
[im 25/25]
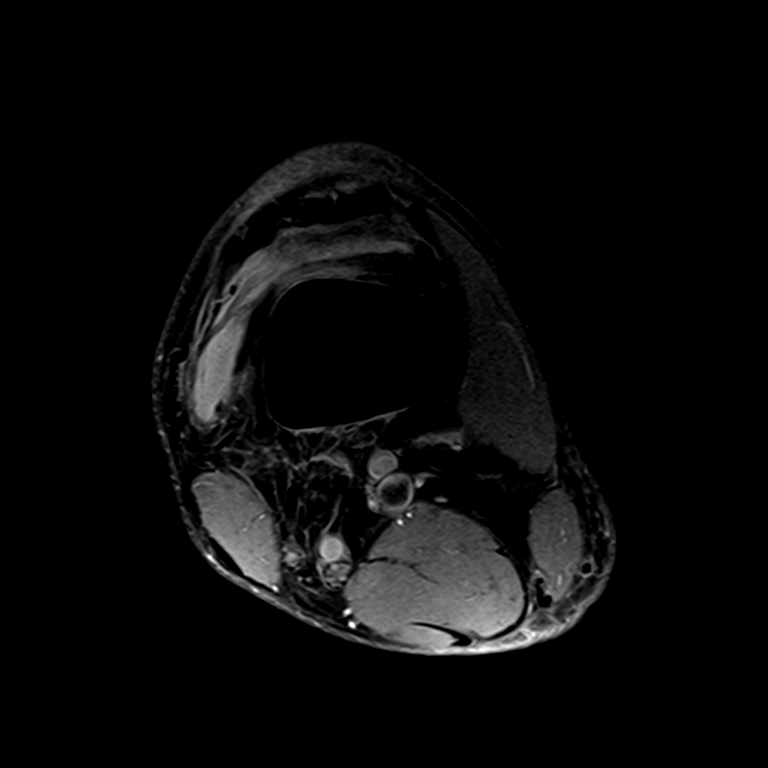

[Series 4: T2 fat-sat · coronal · 3.5mm · 0.50mm/px · 3 of 23 slices shown]
[im 4/23]
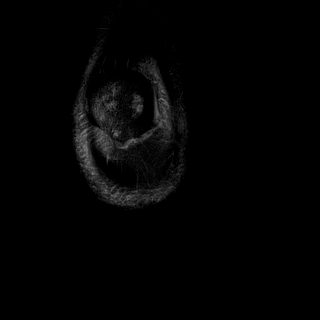
[im 12/23]
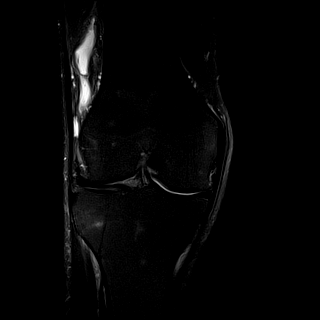
[im 19/23]
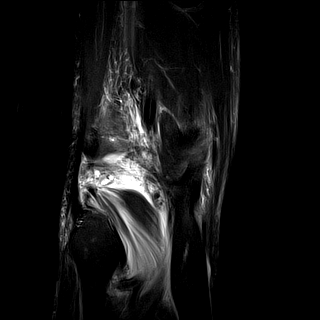

[Series 6: PD fat-sat · coronal · 3.5mm · 0.42mm/px · 3 of 23 slices shown (2 of 3)]
[im 4/23]
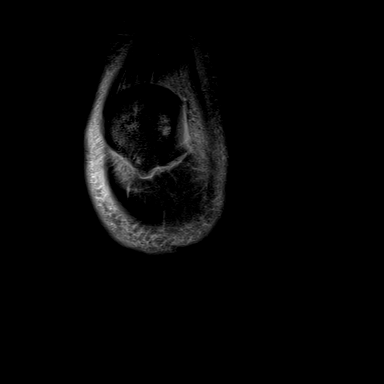
[im 12/23]
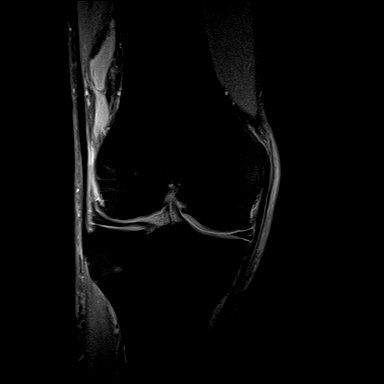
[im 19/23]
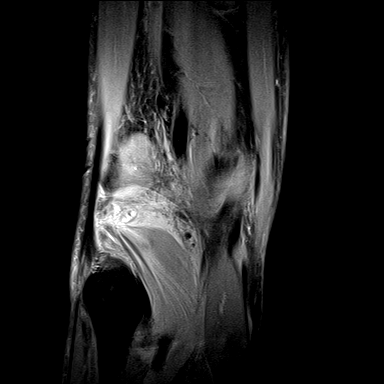

[Series 7: PD fat-sat · sagittal · 3.5mm · 0.21mm/px · 3 of 23 slices shown (3 of 3)]
[im 4/23]
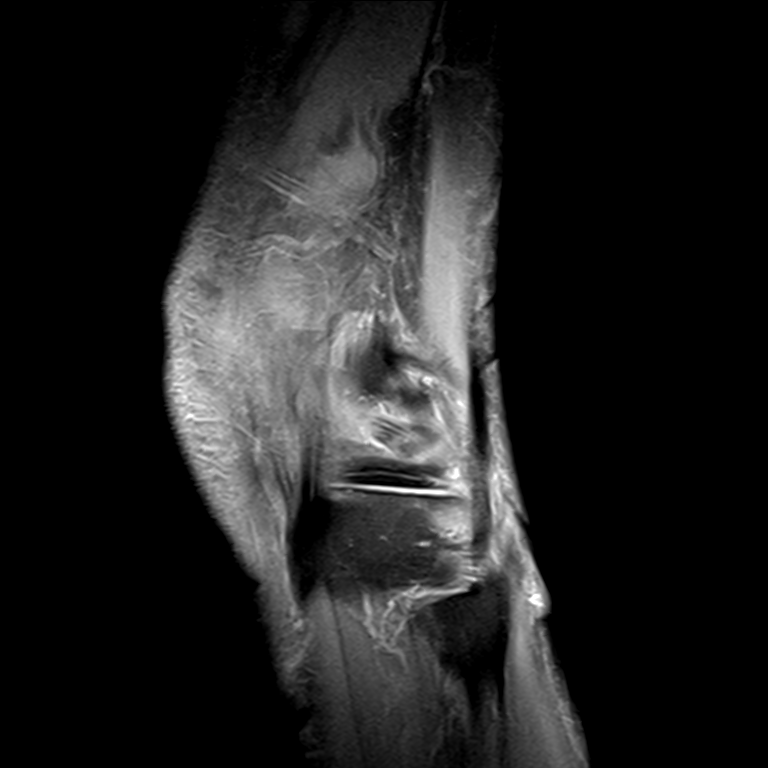
[im 12/23]
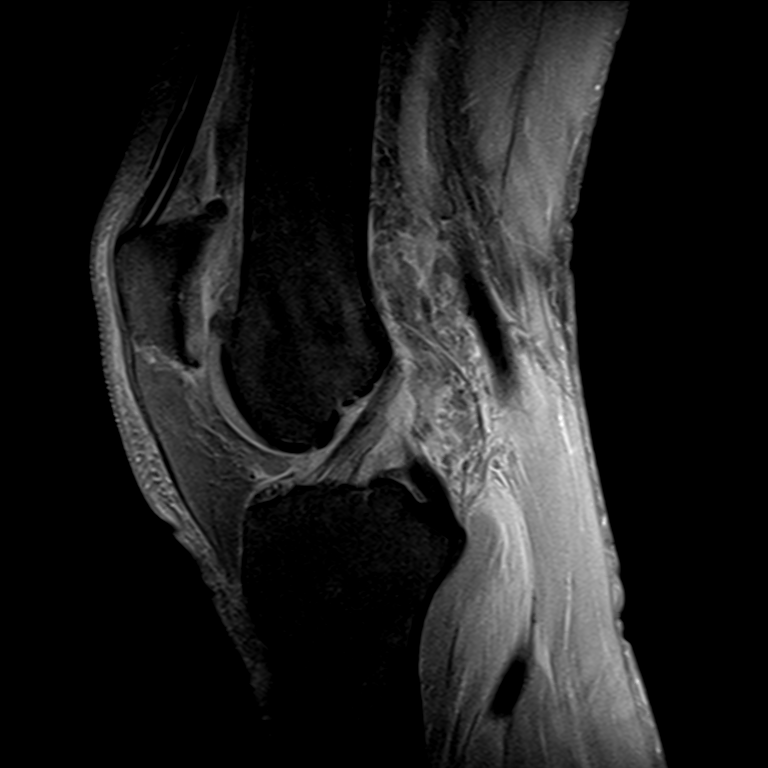
[im 19/23]
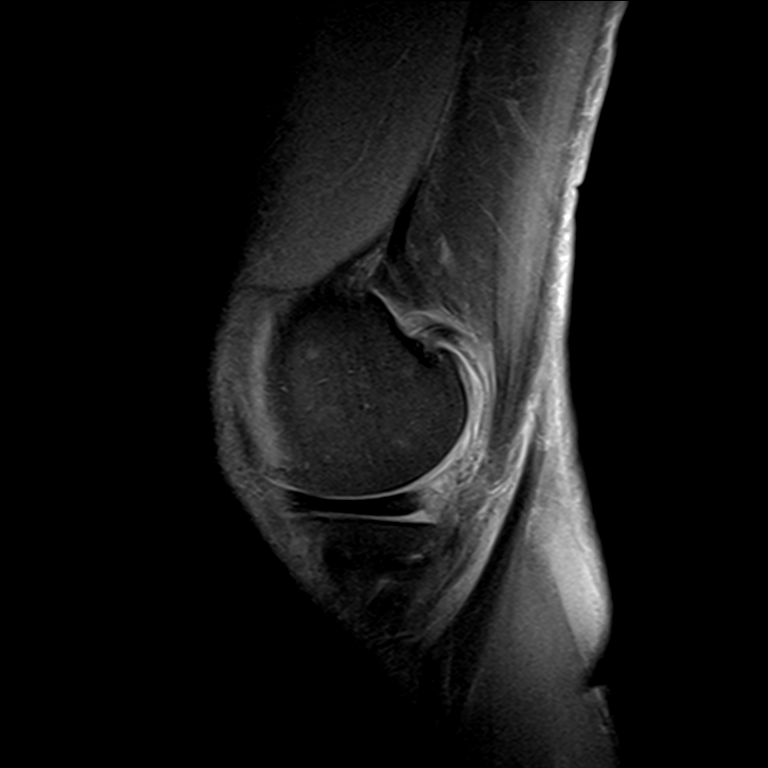

[13 of 40 positions shown; findings below may reference images not displayed]

FINDINGS: MENISCI

Medial meniscus:  Intact with normal morphology.

Lateral meniscus:  Intact with normal morphology.

LIGAMENTS

Cruciates:  Intact.

Collaterals:  Intact.

CARTILAGE

Patellofemoral: Severe patellofemoral degenerative changes are again
noted with marked chondral thinning and osteophytes, especially
laterally. There is subchondral edema and cyst formation at the
patellar apex, lateral facet and lateral trochlea.

Medial: Mild chondral thinning with progressive central and
peripheral osteophyte formation.

Lateral: Mild chondral thinning with progressive central and
peripheral osteophyte formation.

Joint: Complex knee joint effusion has decreased in volume. Prior
study was performed with contrast, demonstrating diffuse synovial
enhancement. Small loose bodies are again noted posteriorly.

Popliteal Fossa: There is progressive complex fluid within the
popliteus hiatus. There are small loose bodies surrounding the
popliteus muscle. Septated synovial cyst posterior to the distal
femoral metaphysis has mildly enlarged. There is no typical Baker's
cyst.

Extensor Mechanism:  Intact.

Bones:  No significant extra-articular osseous findings.
IMPRESSION: 1. Age advanced tricompartmental degenerative changes are again
noted, most advanced within the patellofemoral compartment. These
degenerative changes appear mildly progressive, and multiple loose
bodies are again noted.
2. Although the volume of joint fluid has decreased, there is still
complex fluid in the joint and popliteus hiatus with evidence of
synovitis on prior examination. Fluid within the popliteus hiatus
has significantly increased in volume.
3. Intact menisci, cruciate and collateral ligaments.

## 2016-04-28 DIAGNOSIS — M1711 Unilateral primary osteoarthritis, right knee: Secondary | ICD-10-CM | POA: Diagnosis not present

## 2016-04-28 DIAGNOSIS — M1712 Unilateral primary osteoarthritis, left knee: Secondary | ICD-10-CM | POA: Diagnosis not present

## 2016-05-26 DIAGNOSIS — L700 Acne vulgaris: Secondary | ICD-10-CM | POA: Diagnosis not present

## 2016-05-26 DIAGNOSIS — L608 Other nail disorders: Secondary | ICD-10-CM | POA: Diagnosis not present

## 2016-05-26 DIAGNOSIS — Z5181 Encounter for therapeutic drug level monitoring: Secondary | ICD-10-CM | POA: Diagnosis not present

## 2016-05-26 DIAGNOSIS — Z23 Encounter for immunization: Secondary | ICD-10-CM | POA: Diagnosis not present

## 2016-11-17 DIAGNOSIS — N920 Excessive and frequent menstruation with regular cycle: Secondary | ICD-10-CM | POA: Diagnosis not present

## 2016-11-17 DIAGNOSIS — Z01419 Encounter for gynecological examination (general) (routine) without abnormal findings: Secondary | ICD-10-CM | POA: Diagnosis not present

## 2016-11-17 DIAGNOSIS — R5383 Other fatigue: Secondary | ICD-10-CM | POA: Diagnosis not present

## 2016-11-17 DIAGNOSIS — Z13220 Encounter for screening for lipoid disorders: Secondary | ICD-10-CM | POA: Diagnosis not present

## 2016-11-17 DIAGNOSIS — Z1231 Encounter for screening mammogram for malignant neoplasm of breast: Secondary | ICD-10-CM | POA: Diagnosis not present

## 2016-11-17 DIAGNOSIS — R1032 Left lower quadrant pain: Secondary | ICD-10-CM | POA: Diagnosis not present

## 2016-11-17 DIAGNOSIS — Z1329 Encounter for screening for other suspected endocrine disorder: Secondary | ICD-10-CM | POA: Diagnosis not present

## 2016-11-17 DIAGNOSIS — R350 Frequency of micturition: Secondary | ICD-10-CM | POA: Diagnosis not present

## 2017-03-21 ENCOUNTER — Ambulatory Visit (INDEPENDENT_AMBULATORY_CARE_PROVIDER_SITE_OTHER): Payer: BLUE CROSS/BLUE SHIELD | Admitting: Orthopedic Surgery

## 2017-03-21 DIAGNOSIS — M170 Bilateral primary osteoarthritis of knee: Secondary | ICD-10-CM

## 2017-03-23 ENCOUNTER — Encounter (INDEPENDENT_AMBULATORY_CARE_PROVIDER_SITE_OTHER): Payer: Self-pay | Admitting: Orthopedic Surgery

## 2017-03-23 DIAGNOSIS — M1712 Unilateral primary osteoarthritis, left knee: Secondary | ICD-10-CM | POA: Diagnosis not present

## 2017-03-23 MED ORDER — HYLAN G-F 20 48 MG/6ML IX SOSY
48.0000 mg | PREFILLED_SYRINGE | INTRA_ARTICULAR | Status: AC | PRN
  Administered 2017-03-23: 48 mg via INTRA_ARTICULAR

## 2017-03-23 MED ORDER — LIDOCAINE HCL 1 % IJ SOLN
5.0000 mL | INTRAMUSCULAR | Status: AC | PRN
  Administered 2017-03-23: 5 mL

## 2017-03-23 NOTE — Progress Notes (Signed)
   Procedure Note  Patient: Sherry Gross             Date of Birth: 08-18-1966           MRN: [id number]             Visit Date: 03/21/2017  Procedures: Visit Diagnoses: No diagnosis found.  Large Joint Inj Date/Time: 03/23/2017 4:33 PM Performed by: Cammy CopaEAN, SCOTT Ellianah Cordy Authorized by: Cammy CopaEAN, SCOTT Lannah Koike   Consent Given by:  Patient Site marked: the procedure site was marked   Timeout: prior to procedure the correct patient, procedure, and site was verified   Indications:  Pain, joint swelling and diagnostic evaluation Location:  Knee Site:  L knee Prep: patient was prepped and draped in usual sterile fashion   Needle Size:  18 G Needle Length:  1.5 inches Approach:  Superolateral Ultrasound Guidance: No   Fluoroscopic Guidance: No   Arthrogram: No   Medications:  5 mL lidocaine 1 %; 48 mg Hylan 48 MG/6ML Aspiration Attempted: Yes   Aspirate amount (mL):  25 Aspirate:  Yellow Patient tolerance:  Patient tolerated the procedure well with no immediate complications   Sherry Gross comes in for bilateral Synvisc injections in her knees.  She has fairly significant patellofemoral arthritis.  She was doing some type of mud obstacle course and aggravated her left knee.  That knee has some effusion.  She's done well to Synvisc injections.  These are repeated today and I will see her back as needed for further injections in 6 months or more.

## 2017-06-14 DIAGNOSIS — L700 Acne vulgaris: Secondary | ICD-10-CM | POA: Diagnosis not present

## 2017-06-14 DIAGNOSIS — Z79899 Other long term (current) drug therapy: Secondary | ICD-10-CM | POA: Diagnosis not present

## 2017-08-10 DIAGNOSIS — H5203 Hypermetropia, bilateral: Secondary | ICD-10-CM | POA: Diagnosis not present

## 2017-11-02 DIAGNOSIS — Z01419 Encounter for gynecological examination (general) (routine) without abnormal findings: Secondary | ICD-10-CM | POA: Diagnosis not present

## 2017-11-02 DIAGNOSIS — Z1151 Encounter for screening for human papillomavirus (HPV): Secondary | ICD-10-CM | POA: Diagnosis not present

## 2017-11-02 DIAGNOSIS — Z1231 Encounter for screening mammogram for malignant neoplasm of breast: Secondary | ICD-10-CM | POA: Diagnosis not present

## 2017-11-02 DIAGNOSIS — Z0000 Encounter for general adult medical examination without abnormal findings: Secondary | ICD-10-CM | POA: Diagnosis not present

## 2017-11-16 DIAGNOSIS — R109 Unspecified abdominal pain: Secondary | ICD-10-CM | POA: Diagnosis not present

## 2018-01-09 ENCOUNTER — Telehealth (INDEPENDENT_AMBULATORY_CARE_PROVIDER_SITE_OTHER): Payer: Self-pay | Admitting: Orthopedic Surgery

## 2018-01-09 NOTE — Telephone Encounter (Signed)
Patient called to schedule bil knee injection (Synvisc) Patient is scheduled 01/25/18 for the injection. The number to contact patient is [phone]

## 2018-01-09 NOTE — Telephone Encounter (Signed)
Can we please get auth for these bilateral knee synvisc one injections prior to patients appt.

## 2018-01-15 ENCOUNTER — Telehealth (INDEPENDENT_AMBULATORY_CARE_PROVIDER_SITE_OTHER): Payer: Self-pay

## 2018-01-15 NOTE — Telephone Encounter (Signed)
Submitted application online for SynviscOne injection, bilateral knee. 

## 2018-01-18 ENCOUNTER — Telehealth (INDEPENDENT_AMBULATORY_CARE_PROVIDER_SITE_OTHER): Payer: Self-pay

## 2018-01-18 NOTE — Telephone Encounter (Signed)
PA Required for SynviscOne inj. Bilateral knee. Faxed completed PA form to [facility] at [phone].

## 2018-01-22 ENCOUNTER — Telehealth (INDEPENDENT_AMBULATORY_CARE_PROVIDER_SITE_OTHER): Payer: Self-pay

## 2018-01-22 NOTE — Telephone Encounter (Signed)
Authorization approved for SynviscOne, Bilateral knee through [employer]. Reference# [id number] Valid 01/18/18-01/18/2019  Covered at 100% after $20.00 co-pay Buy & Bill Appt. 01/25/18

## 2018-01-25 ENCOUNTER — Ambulatory Visit (INDEPENDENT_AMBULATORY_CARE_PROVIDER_SITE_OTHER): Payer: BLUE CROSS/BLUE SHIELD | Admitting: Orthopedic Surgery

## 2018-01-25 ENCOUNTER — Encounter (INDEPENDENT_AMBULATORY_CARE_PROVIDER_SITE_OTHER): Payer: Self-pay | Admitting: Orthopedic Surgery

## 2018-01-25 DIAGNOSIS — M1711 Unilateral primary osteoarthritis, right knee: Secondary | ICD-10-CM

## 2018-01-25 DIAGNOSIS — M1712 Unilateral primary osteoarthritis, left knee: Secondary | ICD-10-CM

## 2018-01-26 ENCOUNTER — Encounter (INDEPENDENT_AMBULATORY_CARE_PROVIDER_SITE_OTHER): Payer: Self-pay | Admitting: Orthopedic Surgery

## 2018-01-26 DIAGNOSIS — M1712 Unilateral primary osteoarthritis, left knee: Secondary | ICD-10-CM | POA: Diagnosis not present

## 2018-01-26 DIAGNOSIS — M1711 Unilateral primary osteoarthritis, right knee: Secondary | ICD-10-CM | POA: Diagnosis not present

## 2018-01-26 MED ORDER — HYLAN G-F 20 48 MG/6ML IX SOSY
48.0000 mg | PREFILLED_SYRINGE | INTRA_ARTICULAR | Status: AC | PRN
  Administered 2018-01-26: 48 mg via INTRA_ARTICULAR

## 2018-01-26 MED ORDER — LIDOCAINE HCL 1 % IJ SOLN
5.0000 mL | INTRAMUSCULAR | Status: AC | PRN
  Administered 2018-01-26: 5 mL

## 2018-01-26 NOTE — Progress Notes (Signed)
   Procedure Note  Patient: Sherry Gross             Date of Birth: 11/27/1965           MRN: [id number]             Visit Date: 01/25/2018  Procedures: Visit Diagnoses: Unilateral primary osteoarthritis, left knee  Unilateral primary osteoarthritis, right knee  Large Joint Inj: bilateral knee on 01/26/2018 9:51 AM Indications: diagnostic evaluation, joint swelling and pain Details: 18 G 1.5 in needle, superolateral approach  Arthrogram: No  Medications (Right): 5 mL lidocaine 1 %; 48 mg Hylan 48 MG/6ML Medications (Left): 5 mL lidocaine 1 %; 48 mg Hylan 48 MG/6ML Outcome: tolerated well, no immediate complications Procedure, treatment alternatives, risks and benefits explained, specific risks discussed. Consent was given by the patient. Immediately prior to procedure a time out was called to verify the correct patient, procedure, equipment, support staff and site/side marked as required. Patient was prepped and draped in the usual sterile fashion.    Patient has bilateral knee arthritis.  She has diminished her running.  She is riding horses mostly.  Doing elliptical some.  On exam she has trace effusion and some evidence of generalized muscle wasting.  She also has bunions and she states that they are painful.  She does not want to consider intervention for that problem yet.

## 2018-02-07 DIAGNOSIS — N390 Urinary tract infection, site not specified: Secondary | ICD-10-CM | POA: Diagnosis not present

## 2018-03-21 ENCOUNTER — Telehealth (INDEPENDENT_AMBULATORY_CARE_PROVIDER_SITE_OTHER): Payer: Self-pay | Admitting: Orthopedic Surgery

## 2018-03-21 DIAGNOSIS — M25562 Pain in left knee: Principal | ICD-10-CM

## 2018-03-21 DIAGNOSIS — G8929 Other chronic pain: Secondary | ICD-10-CM

## 2018-03-21 NOTE — Telephone Encounter (Signed)
Patient called stating her left knee is hurting so bad that she is waking up in the middle of the night. Patient asked is this normal for the pain to be so sharpe. The number to contact patient is [phone]

## 2018-03-21 NOTE — Telephone Encounter (Signed)
Please advise. Thanks.  

## 2018-03-22 NOTE — Telephone Encounter (Signed)
Order submitted for scan

## 2018-03-22 NOTE — Telephone Encounter (Signed)
[facility]  She is having severe left knee pain.  All on the medial side.  She is able to run but she has pain at rest as well as pain that wakes her from sleep at night.  Had MRI scan on the right knee 4 years ago.  Just had patellofemoral arthritis.  I think based on the symptoms and based on her lack of response to the injection at this point that MRI scan is indicated on the left knee.  Would you mind setting that up and then letting her know when it can be done thanks

## 2018-04-05 ENCOUNTER — Telehealth (INDEPENDENT_AMBULATORY_CARE_PROVIDER_SITE_OTHER): Payer: Self-pay

## 2018-04-05 ENCOUNTER — Ambulatory Visit
Admission: RE | Admit: 2018-04-05 | Discharge: 2018-04-05 | Disposition: A | Discharge status: Home / Self Care | Payer: BLUE CROSS/BLUE SHIELD | Source: Ambulatory Visit | Attending: Orthopedic Surgery | Admitting: Orthopedic Surgery

## 2018-04-05 ENCOUNTER — Other Ambulatory Visit: Payer: BLUE CROSS/BLUE SHIELD

## 2018-04-05 DIAGNOSIS — G8929 Other chronic pain: Secondary | ICD-10-CM

## 2018-04-05 DIAGNOSIS — M25562 Pain in left knee: Principal | ICD-10-CM

## 2018-04-05 NOTE — Telephone Encounter (Signed)
Patient request a call to discuss MRI results. Scan done 04/05/18.

## 2018-04-09 NOTE — Telephone Encounter (Signed)
I called and left messageI called and left message

## 2018-08-29 DIAGNOSIS — Z79899 Other long term (current) drug therapy: Secondary | ICD-10-CM | POA: Diagnosis not present

## 2018-08-29 DIAGNOSIS — L719 Rosacea, unspecified: Secondary | ICD-10-CM | POA: Diagnosis not present

## 2018-08-29 DIAGNOSIS — L700 Acne vulgaris: Secondary | ICD-10-CM | POA: Diagnosis not present

## 2018-08-29 DIAGNOSIS — L738 Other specified follicular disorders: Secondary | ICD-10-CM | POA: Diagnosis not present

## 2018-10-11 ENCOUNTER — Ambulatory Visit (INDEPENDENT_AMBULATORY_CARE_PROVIDER_SITE_OTHER): Payer: BLUE CROSS/BLUE SHIELD

## 2018-10-11 ENCOUNTER — Ambulatory Visit (INDEPENDENT_AMBULATORY_CARE_PROVIDER_SITE_OTHER): Payer: BLUE CROSS/BLUE SHIELD | Admitting: Family Medicine

## 2018-10-11 ENCOUNTER — Encounter (INDEPENDENT_AMBULATORY_CARE_PROVIDER_SITE_OTHER): Payer: Self-pay | Admitting: Family Medicine

## 2018-10-11 DIAGNOSIS — M79671 Pain in right foot: Secondary | ICD-10-CM

## 2018-10-11 NOTE — Progress Notes (Signed)
Office Visit Note   Patient: Sherry Gross           Date of Birth: 1965/12/04           MRN: [id number] Visit Date: 10/11/2018 Requested by: Enid Baas, MD [address] [place], [place] [address] PCP: Enid Baas, MD  Subjective: Chief Complaint  Patient presents with  . Right Foot - Pain    Pain in the ball of the foot x 3 months, but worse over the last few weeks.  Worse pain on the elliptical than with running.    HPI: She is here with right foot pain.  Symptoms started about 3 or 4 months ago, no injury.  Gradual onset of pain on the ball.  Then recently she switched to some old orthotics because she was wearing out the insoles of her shoe, and during 1 of her treadmill runs she felt something pop in her foot and it is been hurting severely since then.  She has a history of small second metatarsal stress fracture in 2016.  She has been asymptomatic since then.  She is a supinator when she runs.  She has been doing elliptical trainer as well and this causes more pain than running.  Walking also hurts more than running.              ROS: Otherwise noncontributory  Objective: Vital Signs: There were no vitals taken for this visit.  Physical Exam:  Right foot:  Exquisite tenderness to palpation of the second MTP joint.  There is soft tissue swelling on the dorsum of her foot near the second and third MTP joints.  Exquisitely tender on the plantar aspect of the second MTP joint as well.  No pain with flexion or extension of the toes.  Imaging: X-rays right foot: There is cortical elevation at the distal medial second metatarsal shaft seen best on the oblique view, concerning for stress fracture.  There appears to be chondrocalcinosis in the third MTP joint.  Early arthritic change in the second MTP joint and moderate to severe in the first.  Assessment & Plan: 1.  Right foot pain concerning for distal second metatarsal stress fracture.  Cannot rule out second-third  interspace neuroma although most of her pain seems to be bony. -Activity modification for the next several weeks.  If pain persist we will repeat x-rays.  If x-rays are unchanged, then possibly a one-time neuroma injection.  If still no improvement then MRI scan.   Follow-Up Instructions: No follow-ups on file.      Procedures: No procedures performed  No notes on file    PMFS History: Patient Active Problem List   Diagnosis Date Noted  . BURSITIS, HIP 06/06/2010  . HEEL PAIN 02/18/2010  . ACUTE BRONCHITIS 11/30/2008  . RIB PAIN, LEFT SIDED 11/30/2008  . CHRONIC MAXILLARY SINUSITIS 01/31/2008  . HEADACHE 01/31/2008  . STRESS FRACTURE, FOOT 06/28/2007  . ENTHESOPATHY, SITE NOS 01/18/2007  . ANOREXIA NERVOSA 01/01/2007  . HIP PAIN 01/01/2007  . LEG LEGNTH DISCREPANCY 01/01/2007   Past Medical History:  Diagnosis Date  . IBS (irritable bowel syndrome)   . Renal cyst     History reviewed. No pertinent family history.  Past Surgical History:  Procedure Laterality Date  . LITHOTRIPSY     Social History   Occupational History  . Not on file  Tobacco Use  . Smoking status: Never Smoker  . Smokeless tobacco: Never Used  Substance and Sexual Activity  .  Alcohol use: No  . Drug use: No  . Sexual activity: Not on file

## 2018-10-25 ENCOUNTER — Telehealth (INDEPENDENT_AMBULATORY_CARE_PROVIDER_SITE_OTHER): Payer: Self-pay

## 2018-10-25 ENCOUNTER — Telehealth (INDEPENDENT_AMBULATORY_CARE_PROVIDER_SITE_OTHER): Payer: Self-pay | Admitting: Family Medicine

## 2018-10-25 NOTE — Telephone Encounter (Signed)
I left full message on the patient's voice mail. She will be called to schedule an appointment once prior approval from the insurance company has been obtained.

## 2018-10-25 NOTE — Telephone Encounter (Signed)
Patient called asked if she can she get the gel injection in both of her knees. The number to contact patient is [phone]

## 2018-10-25 NOTE — Telephone Encounter (Signed)
Please advise 

## 2018-10-25 NOTE — Telephone Encounter (Signed)
Noted. Thank You.

## 2018-10-25 NOTE — Telephone Encounter (Signed)
Ok to work on Occupational psychologist.

## 2018-10-25 NOTE — Telephone Encounter (Signed)
error 

## 2018-10-28 ENCOUNTER — Telehealth (INDEPENDENT_AMBULATORY_CARE_PROVIDER_SITE_OTHER): Payer: Self-pay

## 2018-10-28 NOTE — Telephone Encounter (Signed)
Submitted VOB for SynviscOne, bilateral knee. 

## 2018-10-31 ENCOUNTER — Telehealth (INDEPENDENT_AMBULATORY_CARE_PROVIDER_SITE_OTHER): Payer: Self-pay

## 2018-10-31 NOTE — Telephone Encounter (Signed)
PA required for SynviscOne, bilateral knee. Faxed completed PA form to [employer] at [phone]. 

## 2018-10-31 NOTE — Telephone Encounter (Signed)
Patient approved for SynviscOne, bilateral knee. Buy & Bill Covered at 1005 after Co-pay Co-pay of $60.00 required PA required PA Approval# [id number] Valid 10/31/2018- 10/31/2019 96.00 units  Appt.11/08/2018 with Dr. Prince Rome.

## 2018-11-01 ENCOUNTER — Ambulatory Visit (INDEPENDENT_AMBULATORY_CARE_PROVIDER_SITE_OTHER): Payer: BLUE CROSS/BLUE SHIELD | Admitting: Family Medicine

## 2018-11-08 ENCOUNTER — Other Ambulatory Visit: Payer: Self-pay

## 2018-11-08 ENCOUNTER — Ambulatory Visit (INDEPENDENT_AMBULATORY_CARE_PROVIDER_SITE_OTHER): Payer: BLUE CROSS/BLUE SHIELD | Admitting: Family Medicine

## 2018-11-08 ENCOUNTER — Encounter (INDEPENDENT_AMBULATORY_CARE_PROVIDER_SITE_OTHER): Payer: Self-pay | Admitting: Family Medicine

## 2018-11-08 DIAGNOSIS — M1712 Unilateral primary osteoarthritis, left knee: Secondary | ICD-10-CM

## 2018-11-08 DIAGNOSIS — M1711 Unilateral primary osteoarthritis, right knee: Secondary | ICD-10-CM

## 2018-11-08 NOTE — Progress Notes (Signed)
Subjective: She is here for planned bilateral Synvisc 1 injections for knee osteoarthritis.  Objective: 1+ effusion in each knee with no warmth or erythema.  Procedure: Bilateral Synvisc-1 injections: After sterile prep with Betadine, injected 5 cc 1% lidocaine without epinephrine, then aspirated 10 cc of blood-tinged synovial fluid from each knee, then injected Synvisc-1 from superolateral approach into both knees.  Follow-up as needed.

## 2018-11-15 DIAGNOSIS — Z1329 Encounter for screening for other suspected endocrine disorder: Secondary | ICD-10-CM | POA: Diagnosis not present

## 2018-11-15 DIAGNOSIS — Z1231 Encounter for screening mammogram for malignant neoplasm of breast: Secondary | ICD-10-CM | POA: Diagnosis not present

## 2018-11-15 DIAGNOSIS — Z131 Encounter for screening for diabetes mellitus: Secondary | ICD-10-CM | POA: Diagnosis not present

## 2018-11-15 DIAGNOSIS — Z13220 Encounter for screening for lipoid disorders: Secondary | ICD-10-CM | POA: Diagnosis not present

## 2018-11-15 DIAGNOSIS — Z130 Encounter for screening for diseases of the blood and blood-forming organs and certain disorders involving the immune mechanism: Secondary | ICD-10-CM | POA: Diagnosis not present

## 2018-11-15 DIAGNOSIS — Z0000 Encounter for general adult medical examination without abnormal findings: Secondary | ICD-10-CM | POA: Diagnosis not present

## 2018-11-15 DIAGNOSIS — Z01419 Encounter for gynecological examination (general) (routine) without abnormal findings: Secondary | ICD-10-CM | POA: Diagnosis not present

## 2019-04-25 ENCOUNTER — Encounter: Payer: Self-pay | Admitting: Family Medicine

## 2019-04-25 ENCOUNTER — Ambulatory Visit (INDEPENDENT_AMBULATORY_CARE_PROVIDER_SITE_OTHER): Payer: BC Managed Care – PPO | Admitting: Family Medicine

## 2019-04-25 ENCOUNTER — Ambulatory Visit: Payer: Self-pay

## 2019-04-25 DIAGNOSIS — M79605 Pain in left leg: Secondary | ICD-10-CM

## 2019-04-25 DIAGNOSIS — R2242 Localized swelling, mass and lump, left lower limb: Secondary | ICD-10-CM | POA: Diagnosis not present

## 2019-04-25 DIAGNOSIS — Z86018 Personal history of other benign neoplasm: Secondary | ICD-10-CM | POA: Diagnosis not present

## 2019-04-25 DIAGNOSIS — R2241 Localized swelling, mass and lump, right lower limb: Secondary | ICD-10-CM | POA: Diagnosis not present

## 2019-04-25 NOTE — Progress Notes (Signed)
Office Visit Note   Patient: Sherry Gross           Date of Birth: 20-Mar-1966           MRN: [id number] Visit Date: 04/25/2019 Requested by: Stefanie Libel, MD [address] [place],  [place] [address] PCP: Stefanie Libel, MD  Subjective: Chief Complaint  Patient presents with  . Left Lower Leg - Mass    New lump lateral lower leg. Minimal pain. Some weakness in the leg.    HPI: She is here with left lower leg mass.  About a week ago she noticed a mass on the lateral side of her calf.  It is slightly tender to touch.  There has been no change in her activities to account for this.  For several years she has had a mass on the medial side of both calves which she attributed to horseback riding.  Knees triggered by the end of the day and become very painful  She has a history of schwannoma removed from her right anterior thigh about 17 years ago.  Denies fevers, and unintentional weight change.              ROS:   All other systems were reviewed and are negative.  Objective: Vital Signs: There were no vitals taken for this visit.  Physical Exam:  General:  Alert and oriented, in no acute distress. Pulm:  Breathing unlabored. Psy:  Normal mood, congruent affect. Skin: No rash or bruising. Legs: On the medial side of both lower legs region of the medial head gastrocnemius she has prominence of muscles.  There is no firmness, no tenderness.  On the lateral side of the left lower leg is similar but more subtle tissue prominence.  Imaging: X-rays of both legs:  OA in both knees, no bony lesion seen.  No soft tissue abnormality seen.  Limited diagnostic ultrasound: I did not record images.  It appears that she has a fascial hernia in the left leg medial gastrocnemius with slightly hypoechoic appearance of muscle fibers.  I did not see a definite defect on the right.  I did not see a distinct in the left lateral leg.    Assessment & Plan: 1.  Chronic bilateral medial lower  leg masses, possible fascial hernia.  More recent lateral left lower leg mass, etiology uncertain.  Personal history of schwannoma. - MRI of left lower leg to evaluate.     Procedures: No procedures performed  No notes on file     PMFS History: Patient Active Problem List   Diagnosis Date Noted  . History of benign schwannoma 04/25/2019  . BURSITIS, HIP 06/06/2010  . HEEL PAIN 02/18/2010  . ACUTE BRONCHITIS 11/30/2008  . RIB PAIN, LEFT SIDED 11/30/2008  . CHRONIC MAXILLARY SINUSITIS 01/31/2008  . HEADACHE 01/31/2008  . STRESS FRACTURE, FOOT 06/28/2007  . ENTHESOPATHY, SITE NOS 01/18/2007  . ANOREXIA NERVOSA 01/01/2007  . HIP PAIN 01/01/2007  . LEG LEGNTH DISCREPANCY 01/01/2007   Past Medical History:  Diagnosis Date  . IBS (irritable bowel syndrome)   . Renal cyst     History reviewed. No pertinent family history.  Past Surgical History:  Procedure Laterality Date  . LITHOTRIPSY     Social History   Occupational History  . Not on file  Tobacco Use  . Smoking status: Never Smoker  . Smokeless tobacco: Never Used  Substance and Sexual Activity  . Alcohol use: No  . Drug use: No  . Sexual  activity: Not on file

## 2019-07-14 ENCOUNTER — Telehealth: Payer: Self-pay | Admitting: Family Medicine

## 2019-07-14 NOTE — Telephone Encounter (Signed)
Synvisc One, bilateral knees (OA). She already has an appointment scheduled on 08/04/19 for the knees, elbow and neck (40 minute appt).

## 2019-07-14 NOTE — Telephone Encounter (Signed)
Ok, please request approval for bilateral knees.

## 2019-07-14 NOTE — Telephone Encounter (Signed)
Please advise. Had Synvisc One injections 11/08/2018.

## 2019-07-14 NOTE — Telephone Encounter (Signed)
Abigail Butts to do.

## 2019-07-14 NOTE — Telephone Encounter (Signed)
Patient left a voicemail message requesting Bil Knee Synvisc One Injection.  CB#[phone].  Thank you.

## 2019-07-17 NOTE — Telephone Encounter (Signed)
Discrepancy in benefits, I have been in communication with patient and I will f/u on this tomorrow.

## 2019-07-17 NOTE — Telephone Encounter (Signed)
VOB submitted online, pending.  Also faxed PA request to [facility] today. Will follow up.

## 2019-07-18 NOTE — Telephone Encounter (Signed)
Ok, thanks.

## 2019-07-18 NOTE — Telephone Encounter (Signed)
PA # [id number], for 96 units.

## 2019-07-18 NOTE — Telephone Encounter (Signed)
I called and s/w [employer], and was told patient will owe a $60 copay if seen in an in network specialist office, and an office visit is billed. Patient is being seen for other diagnoses same day so that should be what her cost will be.  PA pending still.  Will follow up.

## 2019-07-18 NOTE — Telephone Encounter (Signed)
When patient comes in for her Synvisc One injections (bilateral), per patient report you will be seeing her for other diagnoses as well.  Please do charge the OV at that time.  If office visit is billed, she will only be responsible for paying a $60 copay.  I will follow up too to make sure everything bills out accordingly.  Thanks-

## 2019-07-29 ENCOUNTER — Telehealth: Payer: Self-pay

## 2019-07-29 NOTE — Telephone Encounter (Signed)
Appt is Dec 7th.

## 2019-07-29 NOTE — Telephone Encounter (Signed)
Approved for SynviscOne, bilateral knee. Buy & Bill Co-pay of $60.00 per Wendy May PA [id number] Valid 07/17/2019- 07/16/2020  Appt. 08/04/2019 with Dr. Junius Roads

## 2019-08-01 ENCOUNTER — Telehealth: Payer: Self-pay | Admitting: Family Medicine

## 2019-08-01 NOTE — Telephone Encounter (Signed)
I called - she wanted to know what our snow/ice policy is if something does happen on the day of her appointment. We usually just open a little late. Her appointment is at 59, so it might not be affected.

## 2019-08-01 NOTE — Telephone Encounter (Signed)
Patient left a voicemail message stating that she had some questions in regards to her appointment on Monday.  CB#[phone].  Thank you.

## 2019-08-04 ENCOUNTER — Encounter: Payer: Self-pay | Admitting: Family Medicine

## 2019-08-04 ENCOUNTER — Ambulatory Visit: Payer: BC Managed Care – PPO | Admitting: Family Medicine

## 2019-08-04 ENCOUNTER — Other Ambulatory Visit: Payer: Self-pay

## 2019-08-04 DIAGNOSIS — M1711 Unilateral primary osteoarthritis, right knee: Secondary | ICD-10-CM | POA: Diagnosis not present

## 2019-08-04 DIAGNOSIS — M1712 Unilateral primary osteoarthritis, left knee: Secondary | ICD-10-CM | POA: Diagnosis not present

## 2019-08-04 DIAGNOSIS — M79601 Pain in right arm: Secondary | ICD-10-CM

## 2019-08-04 DIAGNOSIS — R200 Anesthesia of skin: Secondary | ICD-10-CM

## 2019-08-04 DIAGNOSIS — M542 Cervicalgia: Secondary | ICD-10-CM | POA: Diagnosis not present

## 2019-08-04 DIAGNOSIS — R202 Paresthesia of skin: Secondary | ICD-10-CM

## 2019-08-04 DIAGNOSIS — G8929 Other chronic pain: Secondary | ICD-10-CM

## 2019-08-04 NOTE — Progress Notes (Signed)
Office Visit Note   Patient: Sherry Gross           Date of Birth: 1966/01/23           MRN: [id number] Visit Date: 08/04/2019 Requested by: Enid Baas, MD [address] [place],  [place] [address] PCP: Enid Baas, MD  Subjective: Chief Complaint  Patient presents with  . Right Knee - Pain    Bilateral Synvisc One injections. Right knee has been catching and painful. Been wearing neoprene sleeve for a couple days.  . Left Knee - Pain    HPI: She is here with multiple concerns.  She has chronic neck pain.  She had an MRI scan in 2013 showing multilevel disc protrusions and spondylosis, narrowing of the central canal at C5-6 and C6-7 with mild by foraminal stenosis at those levels.  She has had epidural injections a couple times with no significant improvement.  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  This has really started to take its toll, causing a significant amount of pain.  A few months ago she had an episode where she was taken off a short as she felt something snap in her right elbow and had almost immediate numbness on the posterior aspect of the elbow.  It was tolerable at first, but now that the weather is cold and she is wearing long sleeve shirts, he does becomes hypersensitive.  This past week, she has had numbness on the dorsum of her hand in the second, third, and fourth fingers.  It was completely numb all day at work 1 day, but now it is intermittent.  She has not noticed any weakness, and it did not affect her ability to touch and hold dental equipment.  She is right-hand dominant.  Her knees are bothering her again.  She has osteoarthritis and is trying to avoid knee replacement.  Her last Synvisc-1 injections were about 8 months ago and she feels like she needs them again.  Now her right knee is starting to catch intermittently.               ROS: No fevers or chills.  All other systems were reviewed and are negative.  Objective: Vital Signs: There were no vitals taken for this visit.  Physical Exam:  General:  Alert and oriented, in no acute distress. Pulm:  Breathing unlabored. Psy:  Normal mood, congruent affect. Skin: No rash. Neck: She still has good range of motion.  Upper extremity strength remains 5/5. Right elbow: She is tender at the common extensor tendon at the radial tunnel.  Mildly positive Tinel's at the ulnar groove.  Mildly tender at the radial tunnel.  No visible muscular atrophy.  She does have some pain with wrist extension and third finger extension against resistance. Knees: No significant effusion on the right but 1+ on the left.  No erythema or warmth.  She has about 3 degree flexion contracture in both knees.   Imaging: None today.  Assessment & Plan: 1.  Chronic neck pain with multiple protrusions and spondylosis -Given the severity of her MRI findings in 2013, I think it may be in her best interest to consider working part-time or possibly even retiring.  I am concerned that the posture required with dental work could make her neck findings worse, resulting in the need for surgery.  2.  Right  arm numbness, could be cervical radicular versus peripheral nerve injury -Nerve conduction studies to evaluate.  3.  Bilateral knee osteoarthritis -Synvisc 1 injections today.     Procedures: Bilateral knee injections: After sterile prep with Betadine, injected 3 cc 1% lidocaine without epinephrine, then attempted aspiration on the right but no fluid was obtained, aspirated about 5 cc of blood-tinged synovial fluid from the left.  Synvisc 1 injected from superolateral approach in both knees.   PMFS History: Patient Active Problem List   Diagnosis Date Noted  . History of benign schwannoma 04/25/2019  . BURSITIS, HIP 06/06/2010  . HEEL PAIN 02/18/2010  . ACUTE BRONCHITIS 11/30/2008  .  RIB PAIN, LEFT SIDED 11/30/2008  . CHRONIC MAXILLARY SINUSITIS 01/31/2008  . HEADACHE 01/31/2008  . STRESS FRACTURE, FOOT 06/28/2007  . ENTHESOPATHY, SITE NOS 01/18/2007  . ANOREXIA NERVOSA 01/01/2007  . HIP PAIN 01/01/2007  . LEG LEGNTH DISCREPANCY 01/01/2007   Past Medical History:  Diagnosis Date  . IBS (irritable bowel syndrome)   . Renal cyst     History reviewed. No pertinent family history.  Past Surgical History:  Procedure Laterality Date  . LITHOTRIPSY     Social History   Occupational History  . Not on file  Tobacco Use  . Smoking status: Never Smoker  . Smokeless tobacco: Never Used  Substance and Sexual Activity  . Alcohol use: No  . Drug use: No  . Sexual activity: Not on file

## 2019-08-25 DIAGNOSIS — H5203 Hypermetropia, bilateral: Secondary | ICD-10-CM | POA: Diagnosis not present

## 2019-08-25 DIAGNOSIS — H10413 Chronic giant papillary conjunctivitis, bilateral: Secondary | ICD-10-CM | POA: Diagnosis not present

## 2019-09-05 ENCOUNTER — Encounter: Payer: Self-pay | Admitting: Physical Medicine and Rehabilitation

## 2019-09-05 ENCOUNTER — Ambulatory Visit (INDEPENDENT_AMBULATORY_CARE_PROVIDER_SITE_OTHER): Payer: BC Managed Care – PPO | Admitting: Physical Medicine and Rehabilitation

## 2019-09-05 ENCOUNTER — Other Ambulatory Visit: Payer: Self-pay

## 2019-09-05 DIAGNOSIS — R202 Paresthesia of skin: Secondary | ICD-10-CM

## 2019-09-05 NOTE — Progress Notes (Signed)
   Numeric Pain Rating Scale and Functional Assessment Average Pain (2)   In the last MONTH (on 0-10 scale) has pain interfered with the following?  1. General activity like being  able to carry out your everyday physical activities such as walking, climbing stairs, carrying groceries, or moving a chair?  Rating(2)     

## 2019-09-08 NOTE — Procedures (Signed)
EMG & NCV Findings: Evaluation of the right median (across palm) sensory nerve showed prolonged distal peak latency (Wrist, 4.1 ms) and prolonged distal peak latency (Palm, 2.4 ms).  All remaining nerves (as indicated in the following tables) were within normal limits.    All examined muscles (as indicated in the following table) showed no evidence of electrical instability.    Impression: The above electrodiagnostic study is ABNORMAL and reveals evidence of a mild right median nerve entrapment at the wrist (carpal tunnel syndrome) affecting sensory components.   There is no significant electrodiagnostic evidence of any other focal nerve entrapment, brachial plexopathy or cervical radiculopathy.   ** As you know, this particular electrodiagnostic study cannot rule out chemical radiculitis or sensory only radiculopathy.  Recommendations: 1.  Follow-up with referring physician. 2.  Continue current management of symptoms.  Suggest diagnostic carpal tunnel injection if symptoms persist.  ___________________________ Naaman Plummer [facility] Board Certified, American Board of Physical Medicine and Rehabilitation    Nerve Conduction Studies Anti Sensory Summary Table   Stim Site NR Peak (ms) Norm Peak (ms) P-T Amp (V) Norm P-T Amp Site1 Site2 Delta-P (ms) Dist (cm) Vel (m/s) Norm Vel (m/s)  Right Median Acr Palm Anti Sensory (2nd Digit)  30.8C  Wrist    *4.1 <3.6 28.0 >10 Wrist Palm 1.7 0.0    Palm    *2.4 <2.0 24.7         Right Radial Anti Sensory (Base 1st Digit)  30.4C  Wrist    2.7 <3.1 13.1  Wrist Base 1st Digit 2.7 0.0    Right Ulnar Anti Sensory (5th Digit)  30.6C  Wrist    3.7 <3.7 25.2 >15.0 Wrist 5th Digit 3.7 14.0 38 >38   Motor Summary Table   Stim Site NR Onset (ms) Norm Onset (ms) O-P Amp (mV) Norm O-P Amp Site1 Site2 Delta-0 (ms) Dist (cm) Vel (m/s) Norm Vel (m/s)  Right Median Motor (Abd Poll Brev)  30.3C  Wrist    3.5 <4.2 5.3 >5 Elbow Wrist 4.0 20.5 51 >50  Elbow     7.5  5.0         Right Ulnar Motor (Abd Dig Min)  30.4C  Wrist    3.1 <4.2 4.9 >3 B Elbow Wrist 3.6 19.0 53 >53  B Elbow    6.7  4.7  A Elbow B Elbow 1.7 9.0 53 >53  A Elbow    8.4  4.2          EMG   Side Muscle Nerve Root Ins Act Fibs Psw Amp Dur Poly Recrt Int Dennie Bible Comment  Right 1stDorInt Ulnar C8-T1 Nml Nml Nml Nml Nml 0 Nml Nml   Right Abd Poll Brev Median C8-T1 Nml Nml Nml Nml Nml 0 Nml Nml   Right ExtDigCom   Nml Nml Nml Nml Nml 0 Nml Nml   Right Triceps Radial C6-7-8 Nml Nml Nml Nml Nml 0 Nml Nml   Right Deltoid Axillary C5-6 Nml Nml Nml Nml Nml 0 Nml Nml     Nerve Conduction Studies Anti Sensory Left/Right Comparison   Stim Site L Lat (ms) R Lat (ms) L-R Lat (ms) L Amp (V) R Amp (V) L-R Amp (%) Site1 Site2 L Vel (m/s) R Vel (m/s) L-R Vel (m/s)  Median Acr Palm Anti Sensory (2nd Digit)  30.8C  Wrist  *4.1   28.0  Wrist Palm     Palm  *2.4   24.7        Radial  Anti Sensory (Base 1st Digit)  30.4C  Wrist  2.7   13.1  Wrist Base 1st Digit     Ulnar Anti Sensory (5th Digit)  30.6C  Wrist  3.7   25.2  Wrist 5th Digit  38    Motor Left/Right Comparison   Stim Site L Lat (ms) R Lat (ms) L-R Lat (ms) L Amp (mV) R Amp (mV) L-R Amp (%) Site1 Site2 L Vel (m/s) R Vel (m/s) L-R Vel (m/s)  Median Motor (Abd Poll Brev)  30.3C  Wrist  3.5   5.3  Elbow Wrist  51   Elbow  7.5   5.0        Ulnar Motor (Abd Dig Min)  30.4C  Wrist  3.1   4.9  B Elbow Wrist  53   B Elbow  6.7   4.7  A Elbow B Elbow  53   A Elbow  8.4   4.2           Waveforms:

## 2019-09-08 NOTE — Progress Notes (Signed)
Sherry Gross - 54 y.o. female MRN [id number]  Date of birth: 1965/09/14  Office Visit Note: Visit Date: 09/05/2019 PCP: Enid Baas, MD Referred by: Enid Baas, MD  Subjective: Chief Complaint  Patient presents with  . Right Hand - Pain   HPI: Sherry Gross is a 54 y.o. female who comes in today At the request of Dr. Lavada Mesi for electrodiagnostic study of the right upper limb.  Patient is right-hand dominant and works as a Education officer, community.  She reports that in August she had some burning pain abruptly in the right lateral elbow.  And then after that burning sensation that she felt like she had impaired sensation in the skin of the elbow.  She then noticed numbness and tingling and decreased sensation in the dorsal forearm and dorsal part of the hand but clearly in the radial digits.  She has had off-and-on issues with the right hand.  She has not had prior electrodiagnostic studies.  She has had issues with her cervical spine and has epidural injections in the past.  She reports some improvement overall in the symptoms.  Her average pain is only a 2 out of 10.  ROS Otherwise per HPI.  Assessment & Plan: Visit Diagnoses:  1. Paresthesia of skin     Plan: Impression: The above electrodiagnostic study is ABNORMAL and reveals evidence of a mild right median nerve entrapment at the wrist (carpal tunnel syndrome) affecting sensory components.   There is no significant electrodiagnostic evidence of any other focal nerve entrapment, brachial plexopathy or cervical radiculopathy.   ** As you know, this particular electrodiagnostic study cannot rule out chemical radiculitis or sensory only radiculopathy.  Recommendations: 1.  Follow-up with referring physician. 2.  Continue current management of symptoms.  Suggest diagnostic carpal tunnel injection if symptoms persist.  Meds & Orders: No orders of the defined types were placed in this encounter.   Orders Placed This Encounter    Procedures  . NCV with EMG (electromyography)    Follow-up: Return in about 2 weeks (around 09/19/2019) for Lavada Mesi, MD.   Procedures: No procedures performed  EMG & NCV Findings: Evaluation of the right median (across palm) sensory nerve showed prolonged distal peak latency (Wrist, 4.1 ms) and prolonged distal peak latency (Palm, 2.4 ms).  All remaining nerves (as indicated in the following tables) were within normal limits.    All examined muscles (as indicated in the following table) showed no evidence of electrical instability.    Impression: The above electrodiagnostic study is ABNORMAL and reveals evidence of a mild right median nerve entrapment at the wrist (carpal tunnel syndrome) affecting sensory components.   There is no significant electrodiagnostic evidence of any other focal nerve entrapment, brachial plexopathy or cervical radiculopathy.   ** As you know, this particular electrodiagnostic study cannot rule out chemical radiculitis or sensory only radiculopathy.  Recommendations: 1.  Follow-up with referring physician. 2.  Continue current management of symptoms.  Suggest diagnostic carpal tunnel injection if symptoms persist.  ___________________________ Naaman Plummer [facility] Board Certified, American Board of Physical Medicine and Rehabilitation    Nerve Conduction Studies Anti Sensory Summary Table   Stim Site NR Peak (ms) Norm Peak (ms) P-T Amp (V) Norm P-T Amp Site1 Site2 Delta-P (ms) Dist (cm) Vel (m/s) Norm Vel (m/s)  Right Median Acr Palm Anti Sensory (2nd Digit)  30.8C  Wrist    *4.1 <3.6 28.0 >10 Wrist Palm 1.7 0.0    Palm    *2.4 <  2.0 24.7         Right Radial Anti Sensory (Base 1st Digit)  30.4C  Wrist    2.7 <3.1 13.1  Wrist Base 1st Digit 2.7 0.0    Right Ulnar Anti Sensory (5th Digit)  30.6C  Wrist    3.7 <3.7 25.2 >15.0 Wrist 5th Digit 3.7 14.0 38 >38   Motor Summary Table   Stim Site NR Onset (ms) Norm Onset (ms) O-P Amp (mV) Norm O-P  Amp Site1 Site2 Delta-0 (ms) Dist (cm) Vel (m/s) Norm Vel (m/s)  Right Median Motor (Abd Poll Brev)  30.3C  Wrist    3.5 <4.2 5.3 >5 Elbow Wrist 4.0 20.5 51 >50  Elbow    7.5  5.0         Right Ulnar Motor (Abd Dig Min)  30.4C  Wrist    3.1 <4.2 4.9 >3 B Elbow Wrist 3.6 19.0 53 >53  B Elbow    6.7  4.7  A Elbow B Elbow 1.7 9.0 53 >53  A Elbow    8.4  4.2          EMG   Side Muscle Nerve Root Ins Act Fibs Psw Amp Dur Poly Recrt Int Dennie Bible Comment  Right 1stDorInt Ulnar C8-T1 Nml Nml Nml Nml Nml 0 Nml Nml   Right Abd Poll Brev Median C8-T1 Nml Nml Nml Nml Nml 0 Nml Nml   Right ExtDigCom   Nml Nml Nml Nml Nml 0 Nml Nml   Right Triceps Radial C6-7-8 Nml Nml Nml Nml Nml 0 Nml Nml   Right Deltoid Axillary C5-6 Nml Nml Nml Nml Nml 0 Nml Nml     Nerve Conduction Studies Anti Sensory Left/Right Comparison   Stim Site L Lat (ms) R Lat (ms) L-R Lat (ms) L Amp (V) R Amp (V) L-R Amp (%) Site1 Site2 L Vel (m/s) R Vel (m/s) L-R Vel (m/s)  Median Acr Palm Anti Sensory (2nd Digit)  30.8C  Wrist  *4.1   28.0  Wrist Palm     Palm  *2.4   24.7        Radial Anti Sensory (Base 1st Digit)  30.4C  Wrist  2.7   13.1  Wrist Base 1st Digit     Ulnar Anti Sensory (5th Digit)  30.6C  Wrist  3.7   25.2  Wrist 5th Digit  38    Motor Left/Right Comparison   Stim Site L Lat (ms) R Lat (ms) L-R Lat (ms) L Amp (mV) R Amp (mV) L-R Amp (%) Site1 Site2 L Vel (m/s) R Vel (m/s) L-R Vel (m/s)  Median Motor (Abd Poll Brev)  30.3C  Wrist  3.5   5.3  Elbow Wrist  51   Elbow  7.5   5.0        Ulnar Motor (Abd Dig Min)  30.4C  Wrist  3.1   4.9  B Elbow Wrist  53   B Elbow  6.7   4.7  A Elbow B Elbow  53   A Elbow  8.4   4.2           Waveforms:             Clinical History: No specialty comments available.   She reports that she has never smoked. She has never used smokeless tobacco. No results for input(s): HGBA1C, LABURIC in the last 8760 hours.  Objective:  VS:  HT:    WT:   BMI:     BP:  HR: bpm  TEMP: ( )  RESP:  Physical Exam Constitutional:      Appearance: She is not ill-appearing.     Comments: Thin appearing  Musculoskeletal:        General: No swelling, tenderness or deformity.     Comments: Inspection reveals no atrophy of the bilateral APB or FDI or hand intrinsics. There is no swelling, color changes, allodynia or dystrophic changes. There is 5 out of 5 strength in the bilateral wrist extension, finger abduction and long finger flexion. There is intact sensation to light touch in all dermatomal and peripheral nerve distributions.  There is a negative Hoffmann's test bilaterally.  Skin:    General: Skin is warm and dry.     Findings: No erythema or rash.  Neurological:     General: No focal deficit present.     Mental Status: She is alert and oriented to person, place, and time.     Motor: No weakness or abnormal muscle tone.     Coordination: Coordination normal.  Psychiatric:        Mood and Affect: Mood normal.        Behavior: Behavior normal.     Ortho Exam Imaging: No results found.  Past Medical/Family/Surgical/Social History: Medications & Allergies reviewed per EMR, new medications updated. Patient Active Problem List   Diagnosis Date Noted  . History of benign schwannoma 04/25/2019  . BURSITIS, HIP 06/06/2010  . HEEL PAIN 02/18/2010  . ACUTE BRONCHITIS 11/30/2008  . RIB PAIN, LEFT SIDED 11/30/2008  . CHRONIC MAXILLARY SINUSITIS 01/31/2008  . HEADACHE 01/31/2008  . STRESS FRACTURE, FOOT 06/28/2007  . ENTHESOPATHY, SITE NOS 01/18/2007  . ANOREXIA NERVOSA 01/01/2007  . HIP PAIN 01/01/2007  . LEG LEGNTH DISCREPANCY 01/01/2007   Past Medical History:  Diagnosis Date  . IBS (irritable bowel syndrome)   . Renal cyst    History reviewed. No pertinent family history. Past Surgical History:  Procedure Laterality Date  . LITHOTRIPSY     Social History   Occupational History  . Not on file  Tobacco Use  . Smoking status: Never Smoker    . Smokeless tobacco: Never Used  Substance and Sexual Activity  . Alcohol use: No  . Drug use: No  . Sexual activity: Not on file

## 2019-09-18 DIAGNOSIS — L700 Acne vulgaris: Secondary | ICD-10-CM | POA: Diagnosis not present

## 2019-09-26 ENCOUNTER — Ambulatory Visit: Payer: BC Managed Care – PPO | Admitting: Family Medicine

## 2019-09-26 ENCOUNTER — Other Ambulatory Visit: Payer: Self-pay

## 2019-09-26 ENCOUNTER — Encounter: Payer: Self-pay | Admitting: Family Medicine

## 2019-09-26 VITALS — BP 97/59 | HR 68 | Ht 64.0 in | Wt 100.0 lb

## 2019-09-26 DIAGNOSIS — M1711 Unilateral primary osteoarthritis, right knee: Secondary | ICD-10-CM | POA: Diagnosis not present

## 2019-09-26 DIAGNOSIS — R200 Anesthesia of skin: Secondary | ICD-10-CM

## 2019-09-26 DIAGNOSIS — R202 Paresthesia of skin: Secondary | ICD-10-CM

## 2019-09-26 DIAGNOSIS — M1712 Unilateral primary osteoarthritis, left knee: Secondary | ICD-10-CM | POA: Diagnosis not present

## 2019-09-26 MED ORDER — GABAPENTIN 100 MG PO CAPS: ORAL_CAPSULE | ORAL | 3 refills | Status: DC

## 2019-09-26 NOTE — Progress Notes (Signed)
   Office Visit Note   Patient: Sherry Gross           Date of Birth: 1965/09/07           MRN: [id number] Visit Date: 09/26/2019 Requested by: Enid Baas, MD [address] [place],  [place] [address] PCP: Enid Baas, MD  Subjective: Chief Complaint  Patient presents with  . Follow-up    Post-bilateral Synvisc1 injections, very painful. Continued pain with no relief since injection.  . Follow-up    Nerve study review of left arm and hand, study by Dr. Alvester Morin on 09/05/2019    HPI: She is here for follow-up.  Nerve studies of the right arm showed mild carpal tunnel syndrome.  This does not really fit with her symptoms, which are more on the dorsal aspect.  Her knees are doing well.  She had a bad reaction to viscosupplementation in the right knee.  She is not sure she wants to try it again on that side.  She is not ready for knee replacement.               ROS: No fevers or chills all other systems were reviewed and are negative.  Objective: Vital Signs: BP (!) 97/59 (BP Location: Right Arm, Patient Position: Sitting, Cuff Size: Normal)   Pulse 68   Ht 5\' 4"  (1.626 m)   Wt 100 lb (45.4 kg)   SpO2 100%   BMI 17.16 kg/m   Physical Exam:  General:  Alert and oriented, in no acute distress. Pulm:  Breathing unlabored. Psy:  Normal mood, congruent affect  Skin: No rash Right arm: She is tender over the lateral epicondyle and has pain with wrist extension against resistance but her strength is normal.  No atrophy of her muscles. Knees: No effusion today.  Imaging: None today  Assessment & Plan: 1.  Persistent right dorsal hand numbness with nerve study showing mild carpal tunnel syndrome but that does not really fit the pattern. -She will try night splints for the next 4 to 6 weeks.  2.  Bilateral knee osteoarthritis -Consider PRP in the future. -Trial of gabapentin for pain     Procedures: No procedures performed  No notes on file     PMFS  History: Patient Active Problem List   Diagnosis Date Noted  . History of benign schwannoma 04/25/2019  . BURSITIS, HIP 06/06/2010  . HEEL PAIN 02/18/2010  . ACUTE BRONCHITIS 11/30/2008  . RIB PAIN, LEFT SIDED 11/30/2008  . CHRONIC MAXILLARY SINUSITIS 01/31/2008  . HEADACHE 01/31/2008  . STRESS FRACTURE, FOOT 06/28/2007  . ENTHESOPATHY, SITE NOS 01/18/2007  . ANOREXIA NERVOSA 01/01/2007  . HIP PAIN 01/01/2007  . LEG LEGNTH DISCREPANCY 01/01/2007   Past Medical History:  Diagnosis Date  . IBS (irritable bowel syndrome)   . Renal cyst     History reviewed. No pertinent family history.  Past Surgical History:  Procedure Laterality Date  . LITHOTRIPSY     Social History   Occupational History  . Not on file  Tobacco Use  . Smoking status: Never Smoker  . Smokeless tobacco: Never Used  Substance and Sexual Activity  . Alcohol use: No  . Drug use: No  . Sexual activity: Not on file

## 2019-09-29 ENCOUNTER — Ambulatory Visit: Payer: BC Managed Care – PPO | Admitting: Family Medicine

## 2019-11-14 DIAGNOSIS — Z130 Encounter for screening for diseases of the blood and blood-forming organs and certain disorders involving the immune mechanism: Secondary | ICD-10-CM | POA: Diagnosis not present

## 2019-11-14 DIAGNOSIS — Z1329 Encounter for screening for other suspected endocrine disorder: Secondary | ICD-10-CM | POA: Diagnosis not present

## 2019-11-14 DIAGNOSIS — Z01419 Encounter for gynecological examination (general) (routine) without abnormal findings: Secondary | ICD-10-CM | POA: Diagnosis not present

## 2019-11-14 DIAGNOSIS — Z13220 Encounter for screening for lipoid disorders: Secondary | ICD-10-CM | POA: Diagnosis not present

## 2019-11-14 DIAGNOSIS — Z1231 Encounter for screening mammogram for malignant neoplasm of breast: Secondary | ICD-10-CM | POA: Diagnosis not present

## 2019-11-14 DIAGNOSIS — Z0000 Encounter for general adult medical examination without abnormal findings: Secondary | ICD-10-CM | POA: Diagnosis not present

## 2019-11-14 DIAGNOSIS — Z131 Encounter for screening for diabetes mellitus: Secondary | ICD-10-CM | POA: Diagnosis not present

## 2019-12-16 DIAGNOSIS — R3915 Urgency of urination: Secondary | ICD-10-CM | POA: Diagnosis not present

## 2019-12-25 DIAGNOSIS — K4090 Unilateral inguinal hernia, without obstruction or gangrene, not specified as recurrent: Secondary | ICD-10-CM | POA: Diagnosis not present

## 2019-12-25 DIAGNOSIS — R1031 Right lower quadrant pain: Secondary | ICD-10-CM | POA: Diagnosis not present

## 2019-12-25 DIAGNOSIS — R350 Frequency of micturition: Secondary | ICD-10-CM | POA: Diagnosis not present

## 2020-02-12 DIAGNOSIS — H60331 Swimmer's ear, right ear: Secondary | ICD-10-CM | POA: Diagnosis not present

## 2020-05-28 ENCOUNTER — Ambulatory Visit (INDEPENDENT_AMBULATORY_CARE_PROVIDER_SITE_OTHER): Payer: BC Managed Care – PPO | Admitting: Family Medicine

## 2020-05-28 ENCOUNTER — Other Ambulatory Visit: Payer: Self-pay

## 2020-05-28 ENCOUNTER — Encounter: Payer: Self-pay | Admitting: Family Medicine

## 2020-05-28 DIAGNOSIS — M542 Cervicalgia: Secondary | ICD-10-CM

## 2020-05-28 DIAGNOSIS — M79642 Pain in left hand: Secondary | ICD-10-CM

## 2020-05-28 DIAGNOSIS — M1712 Unilateral primary osteoarthritis, left knee: Secondary | ICD-10-CM

## 2020-05-28 DIAGNOSIS — M1711 Unilateral primary osteoarthritis, right knee: Secondary | ICD-10-CM

## 2020-05-28 DIAGNOSIS — G8929 Other chronic pain: Secondary | ICD-10-CM

## 2020-05-28 DIAGNOSIS — M79641 Pain in right hand: Secondary | ICD-10-CM

## 2020-05-28 NOTE — Progress Notes (Signed)
Office Visit Note   Patient: Sherry Gross           Date of Birth: 1965-12-08           MRN: [id number] Visit Date: 05/28/2020 Requested by: Enid Baas, MD [address] [place],  [place] [address] PCP: Enid Baas, MD  Subjective: Chief Complaint  Patient presents with  . Neck - Pain    Chronic issue - no current issues with numbness. Hurts to move head side-to-side.  . Right Knee - Pain  . Left Knee - Pain    Continues to wake at night with an intense pain in the knee, lasting 10 seconds or so. She had 3 days of pain in both knees after the Synvisc injections 08/03/20.  Marland Kitchen Chronic pain in the hands    HPI: She is here with chronic neck pain.  Symptoms seem to be getting steadily worse.  No radicular pain, but a lot of pain along the cervical spine especially when she rotates her head to the left.  In the past she has had epidural injections which did not really help much.  She also has ongoing bilateral knee pain due to osteoarthritis.  The last gel injections seemed to cause more pain than usual and she is very hesitant to do any of those again.  She is not ready for knee replacement.  She has chronic bilateral hand pain and stiffness.  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  She realizes that due to her musculoskeletal issues, she is not going to be able to work full-time as a Education officer, community much longer.  She is struggling with this and would like to talk to a counselor.                ROS:   All other systems were reviewed and are negative.  Objective: Vital Signs: There were no vitals taken for this visit.  Physical Exam:  General:  Alert and oriented, in no acute distress. Pulm:  Breathing unlabored. Psy:  Normal mood, congruent affect.  Neck: She has pain with rotation to the left.  She has tender trigger points on the right and left near the C3-4 level.  She has  been in the upper back and trapezius areas as well. Hands: She does not have any erythema or synovitis. Knees: 1+ effusion bilaterally with no warmth or erythema.  She has flexion contracture of about 5 degrees bilaterally.  Both are tender medially.    Imaging: None today  Assessment & Plan: 1.  Chronic neck pain with underlying spondylosis and stenosis due to multiple disc protrusions and arthritic spurring. -We will try physical therapy dry needling. -I support her in her decision to significantly decrease her workload.  She does not want to quit entirely, but hopes to cut back to 2 days/week.  2.  Bilateral hand pain with probable osteoarthritis  3.  Bilateral knee osteoarthritis -We discussed different options and elected to try dextrose prolotherapy today.  We will do another injection in 2 to 3 weeks, and 1/3 injection after that.  We can then do it as needed if she gets good results. -Could contemplate PRP in the future as well.      Procedures: Bilateral knee injections: After sterile prep with Betadine, injected 6 cc 1% lidocaine without epinephrine and 4 cc of 2% dextrose from lateral midpatellar approach in  the right knee and medial midpatellar approach in the left.    PMFS History: Patient Active Problem List   Diagnosis Date Noted  . History of benign schwannoma 04/25/2019  . BURSITIS, HIP 06/06/2010  . HEEL PAIN 02/18/2010  . ACUTE BRONCHITIS 11/30/2008  . RIB PAIN, LEFT SIDED 11/30/2008  . CHRONIC MAXILLARY SINUSITIS 01/31/2008  . HEADACHE 01/31/2008  . STRESS FRACTURE, FOOT 06/28/2007  . ENTHESOPATHY, SITE NOS 01/18/2007  . ANOREXIA NERVOSA 01/01/2007  . HIP PAIN 01/01/2007  . LEG LEGNTH DISCREPANCY 01/01/2007   Past Medical History:  Diagnosis Date  . IBS (irritable bowel syndrome)   . Renal cyst     History reviewed. No pertinent family history.  Past Surgical History:  Procedure Laterality Date  . LITHOTRIPSY     Social History    Occupational History  . Not on file  Tobacco Use  . Smoking status: Never Smoker  . Smokeless tobacco: Never Used  Substance and Sexual Activity  . Alcohol use: No  . Drug use: No  . Sexual activity: Not on file

## 2020-06-03 ENCOUNTER — Encounter: Payer: Self-pay | Admitting: Family Medicine

## 2020-06-11 DIAGNOSIS — F4323 Adjustment disorder with mixed anxiety and depressed mood: Secondary | ICD-10-CM | POA: Diagnosis not present

## 2020-06-17 DIAGNOSIS — F4323 Adjustment disorder with mixed anxiety and depressed mood: Secondary | ICD-10-CM | POA: Diagnosis not present

## 2020-06-18 ENCOUNTER — Encounter: Payer: Self-pay | Admitting: Family Medicine

## 2020-06-18 ENCOUNTER — Other Ambulatory Visit: Payer: Self-pay

## 2020-06-18 ENCOUNTER — Ambulatory Visit: Payer: BC Managed Care – PPO | Admitting: Family Medicine

## 2020-06-18 DIAGNOSIS — M1712 Unilateral primary osteoarthritis, left knee: Secondary | ICD-10-CM | POA: Diagnosis not present

## 2020-06-18 DIAGNOSIS — M1711 Unilateral primary osteoarthritis, right knee: Secondary | ICD-10-CM

## 2020-06-18 NOTE — Progress Notes (Signed)
Subjective: She is here for planned bilateral knee dextrose prolotherapy injections.  Very good response so far, her knees are no longer keeping her awake at night.  Objective: Trace effusion bilaterally with no warmth or erythema.  Impression: Bilateral knee osteoarthritis  Plan: Dextrose injections in each knee today.  Follow-up in 2 weeks for the third injections.  Procedure: Bilateral knee injections:  After sterile prep with Betadine, injected 6 cc 1% lidocaine without epinephrine and 4 cc of 2% dextrose from lateral midpatellar approach in the right knee and medial midpatellar approach in the left.

## 2020-06-25 DIAGNOSIS — F4323 Adjustment disorder with mixed anxiety and depressed mood: Secondary | ICD-10-CM | POA: Diagnosis not present

## 2020-07-02 ENCOUNTER — Other Ambulatory Visit: Payer: Self-pay

## 2020-07-02 ENCOUNTER — Encounter: Payer: Self-pay | Admitting: Family Medicine

## 2020-07-02 ENCOUNTER — Ambulatory Visit (INDEPENDENT_AMBULATORY_CARE_PROVIDER_SITE_OTHER): Payer: BC Managed Care – PPO | Admitting: Family Medicine

## 2020-07-02 DIAGNOSIS — M1712 Unilateral primary osteoarthritis, left knee: Secondary | ICD-10-CM | POA: Diagnosis not present

## 2020-07-02 DIAGNOSIS — F4323 Adjustment disorder with mixed anxiety and depressed mood: Secondary | ICD-10-CM | POA: Diagnosis not present

## 2020-07-02 DIAGNOSIS — M1711 Unilateral primary osteoarthritis, right knee: Secondary | ICD-10-CM

## 2020-07-02 NOTE — Progress Notes (Signed)
Subjective: She is here for bilateral knee dextrose prolotherapy injections, round 3.  Unfortunately last injections seemed to cause irritation in her knees, definitely did not help as much as the 1st injections.  Objective: Trace to 1+ effusion in both knees with no warmth or erythema.  Impression: Bilateral knee DJD  Plan: Dextrose prolotherapy #3 today.  She will follow-up as needed at this point, we can repeat this whenever necessary as long as they help.  Procedure: Bilateral knee injections: After sterile prep with Betadine, injected 6 cc 1% lidocaine without epinephrine and 4 cc 50% dextrose from lateral midpatellar approach in both knees, a flash of clear yellow synovial fluid was obtained prior to each injection, confirming intra-articular placement.  These injections both caused increased pain right afterward.

## 2020-07-09 DIAGNOSIS — F4323 Adjustment disorder with mixed anxiety and depressed mood: Secondary | ICD-10-CM | POA: Diagnosis not present

## 2020-07-10 ENCOUNTER — Ambulatory Visit: Payer: BC Managed Care – PPO | Attending: Internal Medicine

## 2020-07-10 DIAGNOSIS — Z23 Encounter for immunization: Secondary | ICD-10-CM

## 2020-07-10 NOTE — Progress Notes (Signed)
   Covid-19 Vaccination [facility]  Name:  Sherry Gross    MRN: [id number] DOB: 01/30/1966  07/10/2020  Sherry Gross was observed post Covid-19 immunization for 15 minutes without incident. She was provided with Vaccine Information Sheet and instruction to access the V-Safe system.   Sherry Gross was instructed to call 911 with any severe reactions post vaccine: Marland Kitchen Difficulty breathing  . Swelling of face and throat  . A fast heartbeat  . A bad rash all over body  . Dizziness and weakness   Immunizations Administered    No immunizations on file.

## 2020-07-15 ENCOUNTER — Ambulatory Visit: Payer: BC Managed Care – PPO | Admitting: Physical Therapy

## 2020-07-16 DIAGNOSIS — F4323 Adjustment disorder with mixed anxiety and depressed mood: Secondary | ICD-10-CM | POA: Diagnosis not present

## 2020-07-30 ENCOUNTER — Ambulatory Visit: Payer: BC Managed Care – PPO | Admitting: Physical Therapy

## 2020-07-30 DIAGNOSIS — F4323 Adjustment disorder with mixed anxiety and depressed mood: Secondary | ICD-10-CM | POA: Diagnosis not present

## 2020-08-06 DIAGNOSIS — F4323 Adjustment disorder with mixed anxiety and depressed mood: Secondary | ICD-10-CM | POA: Diagnosis not present

## 2020-08-13 DIAGNOSIS — F4323 Adjustment disorder with mixed anxiety and depressed mood: Secondary | ICD-10-CM | POA: Diagnosis not present

## 2020-09-01 ENCOUNTER — Other Ambulatory Visit: Payer: Self-pay | Admitting: Family Medicine

## 2020-09-01 DIAGNOSIS — M1711 Unilateral primary osteoarthritis, right knee: Secondary | ICD-10-CM

## 2020-09-01 DIAGNOSIS — M1712 Unilateral primary osteoarthritis, left knee: Secondary | ICD-10-CM

## 2020-09-01 NOTE — Progress Notes (Signed)
Requesting approval for bilateral gel injections for knee OA. 

## 2020-09-01 NOTE — Progress Notes (Signed)
Noted  

## 2020-09-29 ENCOUNTER — Telehealth: Payer: Self-pay

## 2020-09-29 NOTE — Telephone Encounter (Signed)
Submitted VOB, Synvisc series, bilateral knee.

## 2020-10-13 ENCOUNTER — Encounter: Payer: Self-pay | Admitting: Family Medicine

## 2020-10-13 ENCOUNTER — Telehealth: Payer: Self-pay

## 2020-10-13 NOTE — Telephone Encounter (Signed)
Let's try Durolane please.

## 2020-10-13 NOTE — Telephone Encounter (Signed)
Due to patient being in a lot of pain from previous SynviscOne, should we still submit for gel?  I can submit for Gelsyn--3, which is a series or I can submit for Durolane.  Please let me know.  Thank you

## 2020-10-14 NOTE — Telephone Encounter (Signed)
Due to her insurance changing, would it be okay to do Monovisc?  Monovisc is the preferred medication for [employer].  Please advise.  Thank you.

## 2020-10-15 ENCOUNTER — Telehealth: Payer: Self-pay

## 2020-10-15 NOTE — Telephone Encounter (Signed)
Noted  

## 2020-10-15 NOTE — Telephone Encounter (Signed)
Yes, whichever is preferred is fine.

## 2020-10-15 NOTE — Telephone Encounter (Signed)
Submitted for Durolane, bilateral knee. Pending BV

## 2020-11-23 ENCOUNTER — Other Ambulatory Visit: Payer: Self-pay | Admitting: Obstetrics

## 2020-11-23 DIAGNOSIS — Z13820 Encounter for screening for osteoporosis: Secondary | ICD-10-CM

## 2020-11-23 DIAGNOSIS — E2839 Other primary ovarian failure: Secondary | ICD-10-CM

## 2021-04-08 ENCOUNTER — Other Ambulatory Visit: Payer: Self-pay

## 2021-04-08 ENCOUNTER — Ambulatory Visit (INDEPENDENT_AMBULATORY_CARE_PROVIDER_SITE_OTHER): Payer: Managed Care, Other (non HMO) | Admitting: Family Medicine

## 2021-04-08 ENCOUNTER — Encounter: Payer: Self-pay | Admitting: Family Medicine

## 2021-04-08 DIAGNOSIS — R1031 Right lower quadrant pain: Secondary | ICD-10-CM

## 2021-04-08 NOTE — Progress Notes (Signed)
Office Visit Note   Patient: Sherry Gross           Date of Birth: Jan 19, 1966           MRN: [id number] Visit Date: 04/08/2021 Requested by: Enid Baas, MD [address] [place],  [place] [address] PCP: Enid Baas, MD  Subjective: Chief Complaint  Patient presents with   Right Hip - Pain    Pain in the right groin/anterior hip    HPI: She is here with right groin pain/lower abdominal pain.  She has been working with a Psychologist, educational for the past month or so.  She was diagnosed with a reverse pelvic tilt and has been doing some stretching and strengthening exercises to try to improve her alignment.  At some point she began feeling this pain, there was no definite moment of injury.  At first it was intermittent, but now it hurts most of the time.  She is a runner, and also does a lot of horseback riding.  These activities are inhibited by this pain.  She has history of inguinal hernias but they have not required surgery.  This pain feels similar to that, only it has been more consistent.  She is now working a couple days per week in [place].                ROS:   All other systems were reviewed and are negative.  Objective: Vital Signs: There were no vitals taken for this visit.  Physical Exam:  General:  Alert and oriented, in no acute distress. Pulm:  Breathing unlabored. Psy:  Normal mood, congruent affect. Female chaperone was present. Right hip: She does have pain with hip flexion against resistance as well as adduction.  She has good range of motion of her hip with no popping, and her pain is not reproduced by passive internal rotation.  She is tender just above the right inguinal ligament.  No definite defect palpable.   Imaging: No results found.  Assessment & Plan: Right lower abdominal/groin pain, etiology uncertain.  Could be a lower abdominal strain versus symptomatic inguinal hernia. -She wants to finish her personal training sessions over the next  3 weeks.  If she is still symptomatic, then I would recommend either an MRI of the pelvis or a referral to Dr. Harden Mo for evaluation of inguinal hernia. -Presuming I am not here at that point, she will contact Dr. August Saucer to facilitate that.     Procedures: No procedures performed        PMFS History: Patient Active Problem List   Diagnosis Date Noted   History of benign schwannoma 04/25/2019   BURSITIS, HIP 06/06/2010   HEEL PAIN 02/18/2010   ACUTE BRONCHITIS 11/30/2008   RIB PAIN, LEFT SIDED 11/30/2008   CHRONIC MAXILLARY SINUSITIS 01/31/2008   HEADACHE 01/31/2008   STRESS FRACTURE, FOOT 06/28/2007   ENTHESOPATHY, SITE NOS 01/18/2007   ANOREXIA NERVOSA 01/01/2007   HIP PAIN 01/01/2007   LEG LEGNTH DISCREPANCY 01/01/2007   Past Medical History:  Diagnosis Date   IBS (irritable bowel syndrome)    Renal cyst     History reviewed. No pertinent family history.  Past Surgical History:  Procedure Laterality Date   LITHOTRIPSY     Social History   Occupational History   Not on file  Tobacco Use   Smoking status: Never   Smokeless tobacco: Never  Substance and Sexual Activity   Alcohol use: No   Drug use: No  Sexual activity: Not on file

## 2021-04-11 ENCOUNTER — Encounter: Payer: Self-pay | Admitting: Family Medicine

## 2021-04-11 ENCOUNTER — Other Ambulatory Visit: Payer: Self-pay | Admitting: Family Medicine

## 2021-04-11 DIAGNOSIS — R1031 Right lower quadrant pain: Secondary | ICD-10-CM

## 2021-04-19 ENCOUNTER — Encounter: Payer: Self-pay | Admitting: Family Medicine

## 2021-04-19 ENCOUNTER — Other Ambulatory Visit: Payer: Self-pay | Admitting: Family Medicine

## 2021-04-19 DIAGNOSIS — R1031 Right lower quadrant pain: Secondary | ICD-10-CM

## 2021-04-22 ENCOUNTER — Ambulatory Visit
Admission: RE | Admit: 2021-04-22 | Discharge: 2021-04-22 | Disposition: A | Discharge status: Home / Self Care | Payer: Managed Care, Other (non HMO) | Source: Ambulatory Visit | Attending: Family Medicine | Admitting: Family Medicine

## 2021-04-22 ENCOUNTER — Encounter: Payer: Self-pay | Admitting: Family Medicine

## 2021-04-22 DIAGNOSIS — R1031 Right lower quadrant pain: Secondary | ICD-10-CM

## 2021-04-24 ENCOUNTER — Other Ambulatory Visit: Payer: Managed Care, Other (non HMO)

## 2021-04-25 NOTE — Progress Notes (Signed)
thx

## 2021-04-26 ENCOUNTER — Telehealth: Payer: Self-pay | Admitting: Family Medicine

## 2021-04-26 NOTE — Telephone Encounter (Signed)
Pt called statin she has been speaking with Terri via mychart and would like a call to discuss further.   [phone]

## 2021-04-26 NOTE — Telephone Encounter (Signed)
Note from [facility] and ultrasound has been refaxed/resubmitted to her insurance.

## 2021-04-26 NOTE — Telephone Encounter (Signed)
The patient does not want to see Dr. Dwain Sarna until an MRI is done, revealing she must see a surgeon -- she does not want another evaluation and co-pay. She called [employer], herself, and was told we needed to submit the [place] report to them and let them know she is currently in pain. She also said the other option she was given was for a P2P to be done --- she asks if Dr. August Saucer would do this without seeing her first.

## 2021-04-29 ENCOUNTER — Encounter: Payer: Self-pay | Admitting: *Deleted

## 2021-05-01 ENCOUNTER — Ambulatory Visit
Admission: RE | Admit: 2021-05-01 | Discharge: 2021-05-01 | Disposition: A | Discharge status: Home / Self Care | Payer: Managed Care, Other (non HMO) | Source: Ambulatory Visit | Attending: Family Medicine | Admitting: Family Medicine

## 2021-05-01 DIAGNOSIS — R1031 Right lower quadrant pain: Secondary | ICD-10-CM

## 2021-05-13 ENCOUNTER — Encounter: Payer: Self-pay | Admitting: *Deleted

## 2021-06-27 ENCOUNTER — Ambulatory Visit: Payer: Self-pay

## 2021-06-27 ENCOUNTER — Other Ambulatory Visit: Payer: Self-pay

## 2021-06-27 ENCOUNTER — Ambulatory Visit: Payer: Managed Care, Other (non HMO) | Admitting: Orthopedic Surgery

## 2021-06-27 ENCOUNTER — Encounter: Payer: Self-pay | Admitting: Orthopedic Surgery

## 2021-06-27 VITALS — Ht 64.0 in | Wt 100.0 lb

## 2021-06-27 DIAGNOSIS — M542 Cervicalgia: Secondary | ICD-10-CM | POA: Diagnosis not present

## 2021-06-27 DIAGNOSIS — R202 Paresthesia of skin: Secondary | ICD-10-CM | POA: Diagnosis not present

## 2021-06-27 DIAGNOSIS — R200 Anesthesia of skin: Secondary | ICD-10-CM | POA: Diagnosis not present

## 2021-07-05 ENCOUNTER — Encounter: Payer: Self-pay | Admitting: Orthopedic Surgery

## 2021-07-05 NOTE — Progress Notes (Addendum)
Office Visit Note   Patient: Sherry Gross           Date of Birth: [birth year]           MRN: [id number] Visit Date: 06/27/2021 Requested by: Enid Baas, MD [address] [place],  [place] [address] PCP: Enid Baas, MD  Subjective: Chief Complaint  Patient presents with   Neck - Pain    HPI: Sherry Gross is a 55 year old patient with several month history of neck pain as well as left-sided facial numbness and tongue numbness.  She has known history of some arthritis in her neck.  Not too much in terms of radicular symptoms.  Primarily affecting the left-hand side.  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  This does involve a drive to [facility] which requires turning of her head which is painful at times.  Her knees are relatively stable.  She continues to run and ride horses.  Patient states that when she gets a headache it starts on the left-hand side.  She has episodes where she does develop numbness on the left side of her face and that lasts about 30 minutes and then gets better.  No radicular symptoms.  Has a history of injections with Dr. Berlin Hun more than 3 years ago.  Symptoms have worsened over the last 6 weeks              ROS: All systems reviewed are negative as they relate to the chief complaint within the history of present illness.  Patient denies  fevers or chills.   Assessment & Plan: Visit Diagnoses:  1. Neck pain   2. Left facial numbness   3. Paresthesia     Plan: Impression is neck pain with fairly significant arthritis in the mid cervical spine region.  Some loss of lordosis is present.  This appears to be chronic.  The new fascial aspects of this in terms of numbness and paresthesias merit neurologic work-up.  She has good strength in her bilateral upper extremities.  No physical exam findings concerning for any type of stroke or TIA but the numbness component of this could involve the facial nerve.  No hearing issues.   Plan referral to neurology with MRI scan of the brain to evaluate acoustic neuroma on the left-hand side.  Follow-Up Instructions: No follow-ups on file.   Orders:  Orders Placed This Encounter  Procedures   XR Cervical Spine 2 or 3 views   MR BRAIN WO CONTRAST   Ambulatory referral to Neurology   No orders of the defined types were placed in this encounter.     Procedures: No procedures performed   Clinical Data: No additional findings.  Objective: Vital Signs: Ht 5\' 4"  (1.626 m)   Wt 100 lb (45.4 kg)   BMI 17.16 kg/m   Physical Exam:   Constitutional: Patient appears well-developed HEENT:  Head: Normocephalic Eyes:EOM are normal Neck: Normal range of motion Cardiovascular: Normal rate Pulmonary/chest: Effort normal Neurologic: Patient is alert Skin: Skin is warm Psychiatric: Patient has normal mood and affect   Ortho Exam: Ortho exam demonstrates 5 out of 5 grip EPL FPL interosseous wrist flexion extension bicep triceps and deltoid strength.  No definite paresthesias C5-T1.  Radial pulse intact bilaterally.  No atrophy asymmetrically in the left or right arm musculature.  Neck range of motion is slightly more difficult rotating to the left compared to the right but flexion and extension is about 30 degrees and  40 degrees respectively.  She has no facial asymmetry.  Specialty Comments:  No specialty comments available.  Imaging: No results found.   PMFS History: Patient Active Problem List   Diagnosis Date Noted   History of benign schwannoma 04/25/2019   BURSITIS, HIP 06/06/2010   HEEL PAIN 02/18/2010   ACUTE BRONCHITIS 11/30/2008   RIB PAIN, LEFT SIDED 11/30/2008   CHRONIC MAXILLARY SINUSITIS 01/31/2008   HEADACHE 01/31/2008   STRESS FRACTURE, FOOT 06/28/2007   ENTHESOPATHY, SITE NOS 01/18/2007   ANOREXIA NERVOSA 01/01/2007   HIP PAIN 01/01/2007   LEG LEGNTH DISCREPANCY 01/01/2007   Past Medical History:  Diagnosis Date   IBS (irritable bowel  syndrome)    Renal cyst     History reviewed. No pertinent family history.  Past Surgical History:  Procedure Laterality Date   LITHOTRIPSY     Social History   Occupational History   Not on file  Tobacco Use   Smoking status: Never   Smokeless tobacco: Never  Substance and Sexual Activity   Alcohol use: No   Drug use: No   Sexual activity: Not on file

## 2021-07-08 ENCOUNTER — Telehealth: Payer: Self-pay | Admitting: Orthopedic Surgery

## 2021-07-08 NOTE — Telephone Encounter (Signed)
Pt called asking to have her neurology referral switched to [facility]; she states the facility was rude and can't get her in until late January. She would like to be updated when this is done please.   [phone]

## 2021-07-08 NOTE — Telephone Encounter (Signed)
Order has been edited 

## 2021-07-11 ENCOUNTER — Encounter: Payer: Self-pay | Admitting: Orthopedic Surgery

## 2021-07-17 ENCOUNTER — Ambulatory Visit
Admission: RE | Admit: 2021-07-17 | Discharge: 2021-07-17 | Disposition: A | Discharge status: Home / Self Care | Payer: Managed Care, Other (non HMO) | Source: Ambulatory Visit | Attending: Orthopedic Surgery | Admitting: Orthopedic Surgery

## 2021-07-17 ENCOUNTER — Other Ambulatory Visit: Payer: Self-pay

## 2021-07-17 DIAGNOSIS — R202 Paresthesia of skin: Secondary | ICD-10-CM

## 2021-07-28 NOTE — Progress Notes (Signed)
Tried calling. No answer. No VM picked up to LM.  

## 2021-07-28 NOTE — Progress Notes (Signed)
Brain ok pls claal thx

## 2021-09-19 ENCOUNTER — Encounter: Payer: Self-pay | Admitting: Neurology

## 2021-09-19 ENCOUNTER — Other Ambulatory Visit: Payer: Self-pay

## 2021-09-19 ENCOUNTER — Ambulatory Visit: Payer: Managed Care, Other (non HMO) | Admitting: Neurology

## 2021-09-19 VITALS — BP 100/61 | HR 67 | Ht 64.0 in

## 2021-09-19 DIAGNOSIS — M5412 Radiculopathy, cervical region: Secondary | ICD-10-CM | POA: Insufficient documentation

## 2021-09-19 DIAGNOSIS — G4486 Cervicogenic headache: Secondary | ICD-10-CM | POA: Insufficient documentation

## 2021-09-19 DIAGNOSIS — M5481 Occipital neuralgia: Secondary | ICD-10-CM | POA: Insufficient documentation

## 2021-09-19 DIAGNOSIS — G8929 Other chronic pain: Secondary | ICD-10-CM

## 2021-09-19 DIAGNOSIS — G43109 Migraine with aura, not intractable, without status migrainosus: Secondary | ICD-10-CM | POA: Insufficient documentation

## 2021-09-19 DIAGNOSIS — R2689 Other abnormalities of gait and mobility: Secondary | ICD-10-CM

## 2021-09-19 DIAGNOSIS — M5030 Other cervical disc degeneration, unspecified cervical region: Secondary | ICD-10-CM | POA: Diagnosis not present

## 2021-09-19 DIAGNOSIS — M5382 Other specified dorsopathies, cervical region: Secondary | ICD-10-CM | POA: Diagnosis not present

## 2021-09-19 DIAGNOSIS — R531 Weakness: Secondary | ICD-10-CM

## 2021-09-19 DIAGNOSIS — M542 Cervicalgia: Secondary | ICD-10-CM

## 2021-09-19 NOTE — Progress Notes (Signed)
[facility]    Provider:  Dr Jaynee Eagles Requesting Provider: Marlou Sa Tonna Corner, MD Primary Care Provider:  Stefanie Libel, MD  CC:  Electric shock left side of neck  HPI:  Sherry Gross is a 56 y.o. female here as requested by Meredith Pel, MD for electric shock when she turns her head to the right get an electric shock up the left back of the head and up the head. It progressed, like having an earthquake happening several times a day, then started having numbness in the cheek, temporal area, would happen all at once and would last 20 minutes and then go away. Still has it several times a week but the numbness of the face has resolved. Headaches on the left side, constant, she has had occular migraines in the right eye, left, whole side of the head, "just there", no light or sound sensitivity, no nausea. Chronic neck pain, radiculopathy, imbalance, needs mri cervical spine. She has had nerve blocks int he past when diagnosed with cervicogenic headaches, headaches ongoing since 2009. No other focal neurologic deficits, associated symptoms, inciting events or modifiable factors.  Reviewed notes, labs and imaging from outside physicians, which showed:  XRAys cervical spine: reviewed report only AP lateral cervical spine radiographs reviewed.  There is loss of  lordosis.  Significant degenerative disc disease is present at C4-5 C5-6  and C6-7 with anterior osteophytes most prominent off of the C4 and C5  vertebral bodies.  Facet arthritis also moderate at these levels.  No  acute fracture or spondylolisthesis.  MRI of the brain:Personally reviewed imaging and agree with following Brain: There is no evidence of an acute infarct, intracranial hemorrhage, intracranial mass effect, or extra-axial fluid collection. A few punctate T2 hyperintensities in the cerebral white matter are within normal limits for age. The ventricles and sulci are within normal limits for age. A  cavum vergae is incidentally noted, a normal variant. No mass is evident in the cerebellopontine angles or along the course of the seventh and eighth cranial nerve complexes, however sensitivity for detection of a small mass is limited on this study which does not include dedicated internal auditory canal imaging or IV contrast.   Vascular: Major intracranial vascular flow voids are preserved.   Skull and upper cervical spine: Unremarkable bone marrow signal.   Sinuses/Orbits: Unremarkable orbits. Paranasal sinuses and mastoid air cells are clear.   Other: None.   IMPRESSION: Unremarkable noncontrast appearance of the brain for age.    Review of Systems: Patient complains of symptoms per HPI as well as the following symptoms neck pain. Pertinent negatives and positives per HPI. All others negative.   Social History   Socioeconomic History   Marital status: Married    Spouse name: Not on file   Number of children: Not on file   Years of education: Not on file   Highest education level: Not on file  Occupational History   Not on file  Tobacco Use   Smoking status: Never   Smokeless tobacco: Never  Vaping Use   Vaping Use: Never used  Substance and Sexual Activity   Alcohol use: No   Drug use: No   Sexual activity: Not on file  Other Topics Concern   Not on file  Social History Narrative   Not on file   Social Determinants of Health   Financial Resource Strain: Not on file  Food Insecurity: Not on file  Transportation Needs: Not on file  Physical Activity: Not  on file  Stress: Not on file  Social Connections: Not on file  Intimate Partner Violence: Not on file    Family History  Problem Relation Age of Onset   Migraines Neg Hx    Headache Neg Hx    Neuropathy Neg Hx     Past Medical History:  Diagnosis Date   IBS (irritable bowel syndrome)    Renal cyst     Patient Active Problem List   Diagnosis Date Noted   Migraine aura without headache  09/19/2021   Occipital neuralgia of left side 09/19/2021   Musculoskeletal disorder involving posterior cervical region 09/19/2021   DDD (degenerative disc disease), cervical 09/19/2021   Cervicogenic headache 09/19/2021   Chronic neck pain 09/19/2021   Cervical radiculopathy 09/19/2021   History of benign schwannoma 04/25/2019   BURSITIS, HIP 06/06/2010   HEEL PAIN 02/18/2010   ACUTE BRONCHITIS 11/30/2008   RIB PAIN, LEFT SIDED 11/30/2008   CHRONIC MAXILLARY SINUSITIS 01/31/2008   HEADACHE 01/31/2008   STRESS FRACTURE, FOOT 06/28/2007   ENTHESOPATHY, SITE NOS 01/18/2007   ANOREXIA NERVOSA 01/01/2007   HIP PAIN 01/01/2007   LEG LEGNTH DISCREPANCY 01/01/2007    Past Surgical History:  Procedure Laterality Date   LITHOTRIPSY      Current Outpatient Medications  Medication Sig Dispense Refill   Cascara [place] 450 MG CAPS Take 1 capsule by mouth 2 (two) times daily.     Cholecalciferol 25 MCG (1000 UT) tablet Take by mouth.     famotidine (PEPCID) 20 MG tablet Take 20 mg by mouth daily.     MOTEGRITY 2 MG TABS Take 1 tablet by mouth daily.     Multiple Vitamin (MULTIVITAMIN WITH MINERALS) TABS tablet Take 1 tablet by mouth daily.     omeprazole (PRILOSEC) 20 MG capsule Take 20 mg by mouth 2 (two) times daily before a meal.     OVER THE COUNTER MEDICATION Take 1 capsule by mouth 2 (two) times daily.     OVER THE COUNTER MEDICATION Take 1 tablet by mouth 2 (two) times daily. "swiss kris"     spironolactone (ALDACTONE) 50 MG tablet Take 50 mg by mouth daily.     Current Facility-Administered Medications  Medication Dose Route Frequency Provider Last Rate Last Admin   triamcinolone acetonide (KENALOG) 10 MG/ML injection 10 mg  10 mg Other Once Trula Slade, DPM        Allergies as of 09/19/2021 - Review Complete 09/19/2021  Allergen Reaction Noted   Cephalosporins Itching and Rash 11/15/2013   Penicillins Itching and Rash 11/15/2013    Vitals: BP 100/61    Pulse 67     Ht 5\' 4"  (1.626 m)    BMI 17.16 kg/m  Last Weight:  Wt Readings from Last 1 Encounters:  06/27/21 100 lb (45.4 kg)   Last Height:   Ht Readings from Last 1 Encounters:  09/19/21 5\' 4"  (1.626 m)     Physical exam: Exam: Gen: NAD, conversant, well nourised, well groomed                     CV: RRR, no MRG. No Carotid Bruits. No peripheral edema, warm, nontender Eyes: Conjunctivae clear without exudates or hemorrhage  Neuro: Detailed Neurologic Exam  Speech:    Speech is normal; fluent and spontaneous with normal comprehension.  Cognition:    The patient is oriented to person, place, and time;     recent and remote memory intact;     language  fluent;     normal attention, concentration,     fund of knowledge Cranial Nerves:    The pupils are equal, round, and reactive to light. The fundi are flat Visual fields are full to finger confrontation. Extraocular movements are intact. Trigeminal sensation is intact and the muscles of mastication are normal. The face is symmetric. The palate elevates in the midline. Hearing intact. Voice is normal. Shoulder shrug is normal. The tongue has normal motion without fasciculations.   Coordination:    Normal .   Gait:    normal.   Motor Observation:    No asymmetry, no atrophy, and no involuntary movements noted. Tone:    Normal muscle tone.    Posture:    Posture is normal. normal erect    Strength:    Strength is V/V in the upper and lower limbs.      Sensation: intact to LT     Reflex Exam:  DTR's:    Deep tendon reflexes in the upper and lower extremities are normal bilaterally.   Toes:    The toes are downgoing bilaterally.   Clonus:    Clonus is absent.    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  Cervical radiculopathy: MRI cervical spine as detailed above  Cervicogenic headache/occipital neuralgia: - [facility]/PT - [email or web address] refer to PT for cervicogenic headache cervical myofascial pain. [facility] for PT/and [facility] for occipital neuralgia, cervicogenic headaches, cervical myofascial pain - MRI cervical spine and consider sending to [facility] or pain management doctor for c2/c3 medial branch blocks, RFA, ESI or may need surgical intervention - declined Medications: Lyrica, cymbalta, topiramate, gabapentin prn, flexeril/muscle relaxer at bedtime - discussed risk of stroke in patients with migraine aura  Orders Placed This Encounter  Procedures   MR CERVICAL SPINE [place]   Ambulatory referral to Physical Therapy    Cc: Meredith Pel, MD,  Stefanie Libel, MD  Sarina Ill, MD  [facility] [address] [place], [place] [address]  Phone [phone] Fax [phone]

## 2021-09-19 NOTE — Patient Instructions (Addendum)
[facility]/PT - [email or web address], refer to PT for cervicogenic headache cervical myofascial pain [facility] for PT/and [facility] for occipital neuralgia, cervicogenic headaches, cervical myofascial pain MRI cervical spine and sending to [facility] or pain management doctor for c2/c3 medial branch blocks, RFA, ESI Medications: Lyrica, cymbalta, topiramate, gabapentin prn, flexeril/muscle relaxer at bedtime   There is increased risk for stroke in women with migraine with aura and a contraindication for the combined contraceptive pill for use by women who have migraine with aura. The risk for women with migraine without aura is lower. However other risk factors like smoking are far more likely to increase stroke risk than migraine. There is a recommendation for no smoking and for the use of OCPs without estrogen such as progestogen only pills particularly for women with migraine with aura.Marland Kitchen People who have migraine headaches with auras may be 3 times more likely to have a stroke caused by a blood clot, compared to migraine patients who don't see auras. Women who take hormone-replacement therapy may be 30 percent more likely to suffer a clot-based stroke than women not taking medication containing estrogen. Other risk factors like smoking and high blood pressure may be  much more important.

## 2021-09-20 ENCOUNTER — Telehealth: Payer: Self-pay | Admitting: Neurology

## 2021-09-20 NOTE — Telephone Encounter (Signed)
[employer] pending faxed notes  

## 2021-09-23 ENCOUNTER — Other Ambulatory Visit: Payer: Self-pay

## 2021-09-23 ENCOUNTER — Encounter: Payer: Self-pay | Admitting: Physical Therapy

## 2021-09-23 ENCOUNTER — Ambulatory Visit: Payer: Managed Care, Other (non HMO) | Attending: Neurology | Admitting: Physical Therapy

## 2021-09-23 DIAGNOSIS — R252 Cramp and spasm: Secondary | ICD-10-CM | POA: Insufficient documentation

## 2021-09-23 DIAGNOSIS — M5481 Occipital neuralgia: Secondary | ICD-10-CM | POA: Diagnosis not present

## 2021-09-23 DIAGNOSIS — G8929 Other chronic pain: Secondary | ICD-10-CM | POA: Insufficient documentation

## 2021-09-23 DIAGNOSIS — G4486 Cervicogenic headache: Secondary | ICD-10-CM | POA: Insufficient documentation

## 2021-09-23 DIAGNOSIS — M5382 Other specified dorsopathies, cervical region: Secondary | ICD-10-CM | POA: Insufficient documentation

## 2021-09-23 DIAGNOSIS — M5030 Other cervical disc degeneration, unspecified cervical region: Secondary | ICD-10-CM | POA: Insufficient documentation

## 2021-09-23 DIAGNOSIS — R293 Abnormal posture: Secondary | ICD-10-CM | POA: Insufficient documentation

## 2021-09-23 DIAGNOSIS — M542 Cervicalgia: Secondary | ICD-10-CM | POA: Insufficient documentation

## 2021-09-23 DIAGNOSIS — M5412 Radiculopathy, cervical region: Secondary | ICD-10-CM | POA: Diagnosis not present

## 2021-09-23 NOTE — Therapy (Signed)
[facility] [address], [address] [place], [place], [address] Phone: [phone]   Fax:  [phone]  Physical Therapy Evaluation  Patient Details  Name: Sherry Gross MRN: [id number] Date of Birth: February 04, 1966 Referring Provider (PT): Anson FretAhern, Antonia B, MD   Encounter Date: 09/23/2021   PT End of Session - 09/23/21 0954     Visit Number 1    Number of Visits 13    Date for PT Re-Evaluation 11/04/21    Authorization Type [employer]    PT Start Time 0815   pt late   PT Stop Time 0845    PT Time Calculation (min) 30 min    Activity Tolerance Patient tolerated treatment well    [facility]/per[facility]             Past Medical History:  Diagnosis Date   IBS (irritable bowel syndrome)    Renal cyst     Past Surgical History:  Procedure Laterality Date   LITHOTRIPSY      There were no vitals filed for this visit.    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 Every couple months she would turn her head to the R and get an electric shock up her head; recently (since about August) this has gotten worse and also started to have some facial numbness occurring with it. Pain is on the L side of the base of the skull and worse when rotating head to the R. HAs every week that last up to 3 days. Denies radiation down UEs, imbalance, or dizziness. Better with rest, sometimes meds.    Pertinent History migraines    Limitations Reading;House hold activities;Writing;Sitting;Lifting    Diagnostic tests 1120/22 brain MRI: Unremarkable noncontrast appearance of the brain for age.; 06/27/21 cervical xray: loss of lordosis.  Significant degenerative disc disease is present at C4-5 C5-6 and C6-7 with anterior osteophytes most prominent off of the C4 and C5 vertebral bodies.  Facet arthritis also moderate at these levels    Currently in  Pain? Yes    Pain Score 0-No pain    Pain Location Head   base of skull   Pain Orientation Left    Pain Descriptors / Indicators --   "electric"   Pain Type Chronic pain                [facility] PT Assessment - 09/23/21 0820       Assessment   Medical Diagnosis Cervicogenic headache, DDD (degenerative disc disease), cervical, Musculoskeletal disorder involving posterior cervical region, Occipital neuralgia of left side, Chronic neck pain, Cervical radiculopathy    Referring Provider (PT) Anson FretAhern, Antonia B, MD    Onset Date/Surgical Date 03/28/21    Hand Dominance Right    Next MD Visit 12/20/21    Prior Therapy yes for neck      Precautions   Precautions None      Balance Screen   Has the patient fallen in the past 6 months No    Has the patient had a decrease in activity level because of a fear of falling?  No    Is the patient reluctant to leave their home because of a fear of falling?  No      Home [employer] information [employer] managernvironment   Living Environment Private residence      Prior Function   Level of Independence Independent  Vocation Part time employment    Advice worker    Leisure none      Cognition   Overall Cognitive Status Within Functional Limits for tasks assessed      Sensation   Light Touch Appears Intact      Posture/Postural Control   Posture/Postural Control Postural limitations    Postural Limitations Forward head   B elevated shoulders     ROM / Strength   AROM / PROM / Strength Strength;AROM      AROM   AROM Assessment Site Cervical    Cervical Flexion 35    Cervical Extension 60   pain in neck   Cervical - Right Side Bend 35   pain   Cervical - Left Side Bend 31   pain   Cervical - Right Rotation 52    Cervical - Left Rotation 54      Strength   Strength Assessment Site Shoulder;Elbow;Wrist;Hand    Right/Left Shoulder Right;Left    Right Shoulder Flexion 4+/5    Right Shoulder ABduction 4/5    Right Shoulder Internal Rotation 4+/5     Right Shoulder External Rotation 4/5    Left Shoulder Flexion 4+/5    Left Shoulder ABduction 4+/5    Left Shoulder Internal Rotation 4+/5    Left Shoulder External Rotation 4+/5    Right/Left Elbow Left;Right    Right Elbow Flexion 4+/5    Right Elbow Extension 4+/5    Left Elbow Flexion 4+/5    Left Elbow Extension 4+/5    Right/Left Wrist Right;Left    Right Wrist Flexion 4/5    Right Wrist Extension 4/5    Left Wrist Extension 4+/5    Left Wrist Radial Deviation 4+/5    Right/Left hand Right;Left    Right Hand Grip (lbs) 43.33   45, 42, 43   Left Hand Grip (lbs) 43.67   45, 46, 40     Palpation   Palpation comment TTP over L suboccipitals; very tight L>R in UT, scalenes, rhomboids, UT with palpable taut bands of muscle      Special Tests   Other special tests R/L spurling's and distraction negative   however pt does note improvement from traction                       Objective measurements completed on examination: See above findings.                PT Education - 09/23/21 0953     Education Details prognosis, POC, HEP- Access Code: [email or web address]; handout on DN    Person(s) Educated Patient    Methods Explanation;Demonstration;Tactile cues;Verbal cues;Handout    Comprehension Verbalized understanding              PT Short Term Goals - 09/23/21 1004       PT SHORT TERM GOAL #1   Title Patient to be independent with initial HEP.    Time 3    Period Weeks    Status New    Target Date 10/14/21               PT Long Term Goals - 09/23/21 1004       PT LONG TERM GOAL #1   Title Patient to be independent with advanced HEP.    Time 6    Period Weeks    Status New    Target Date 11/04/21      PT LONG TERM GOAL #  2   Title Patient to demonstrate cervical ROM WFL and without pain limiting.    Time 6    Period Weeks    Status New    Target Date 11/04/21      PT LONG TERM GOAL #3   Title Patient to demonstrate B UE strength  >/=4+/5.    Time 6    Period Weeks    Status New    Target Date 11/04/21      PT LONG TERM GOAL #4   Title Patient to report and demonstrate improved head, neck, and shoulder posture at rest and with activity.    Time 6    Period Weeks    Status New    Target Date 11/04/21      PT LONG TERM GOAL #5   Title Patient to report 50% improvement in intensity/frequency of HAs.    Time 6    Period Weeks    Status New    Target Date 11/04/21      Additional Long Term Goals   Additional Long Term Goals Yes      PT LONG TERM GOAL #6   Title Patient to report 70% improvement in neck/head pain when driving.    Time 6    Period Weeks    Status New    Target Date 11/04/21                    Plan - 09/23/21 0955     Clinical Impression Statement Patient is a 56 y/o F presenting to OPPT with c/o insidious HAs and neck pain worsening since August 2022. Pain is located over the L base of the skull, worse with R cervical rotation. Denies radiation down UEs, imbalance, or dizziness but notes sensation of electric feeling up the head and intermittent facial numbness associated with it. Describes rams horn HA distribution originating from the L occiput. HAs occur weekly and last up to 3 days. 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 Patient today presenting with elevated shoulders and forward head posture, limited and painful cervical AROM, slight R shoulder and wrist weakness, TTP over L suboccipitals, and tightness L>R in UT, scalenes, rhomboids, UT. Patient was educated on gentle postural correction and stretching HEP as well as edu on DN and reported understanding. Would benefit from skilled PT services 1-2x/week for 6 weeks to address aforementioned impairments in order to optimize level of function.    Personal Factors and Comorbidities Age;Comorbidity 1;Time since onset of injury/illness/exacerbation;Profession;Past/Current  Experience    Comorbidities migraines    Examination-Activity Limitations Carry;Bed Mobility;Reach Overhead    Examination-Participation Restrictions Cleaning;Occupation;Driving    Stability/Clinical Decision Making Evolving/Moderate complexity    Clinical Decision Making Moderate    Rehab Potential Good    PT Frequency Other (comment)   1-2x/week   PT Duration 6 weeks    PT Treatment/Interventions ADLs/Self Care Home Management;Cryotherapy;Electrical Stimulation;DME Instruction;Traction;Moist Heat;Functional mobility training;Therapeutic activities;Therapeutic exercise;Neuromuscular re-education;Manual techniques;Patient/family education;Passive range of motion;Dry needling;Energy conservation;Vestibular;Canalith Repostioning;Taping    PT Next Visit Plan reassess HEP; edu further on DN and trial to UT, cervical paraspinals, suboccipitals; progress postural strengthening and cervical ROM    Consulted and Agree with Plan of Care Patient             Patient will benefit from skilled therapeutic intervention in order to improve the following deficits and impairments:  Decreased strength, Impaired sensation, Postural dysfunction, Improper body mechanics, Impaired flexibility, Decreased activity tolerance,  Pain, Increased muscle spasms, Decreased range of motion, Increased fascial restricitons  Visit Diagnosis: Cervicogenic headache  Neck pain  Cramp and spasm  Abnormal posture     Problem List Patient Active Problem List   Diagnosis Date Noted   Migraine aura without headache 09/19/2021   Occipital neuralgia of left side 09/19/2021   Musculoskeletal disorder involving posterior cervical region 09/19/2021   DDD (degenerative disc disease), cervical 09/19/2021   Cervicogenic headache 09/19/2021   Chronic neck pain 09/19/2021   Cervical radiculopathy 09/19/2021   History of benign schwannoma 04/25/2019   BURSITIS, HIP 06/06/2010   HEEL PAIN 02/18/2010   ACUTE BRONCHITIS  11/30/2008   RIB PAIN, LEFT SIDED 11/30/2008   CHRONIC MAXILLARY SINUSITIS 01/31/2008   HEADACHE 01/31/2008   STRESS FRACTURE, FOOT 06/28/2007   ENTHESOPATHY, SITE NOS 01/18/2007   ANOREXIA NERVOSA 01/01/2007   HIP PAIN 01/01/2007   LEG LEGNTH DISCREPANCY 01/01/2007    Anette Guarneri, PT, DPT 09/23/21 10:08 AM   [facility] [address], [address] [place], [place], [address] Phone: [phone]   Fax:  [phone]  Name: Sherry Gross MRN: [id number] Date of Birth: September 06, 1965

## 2021-09-23 NOTE — Patient Instructions (Signed)

## 2021-09-26 ENCOUNTER — Encounter: Payer: Self-pay | Admitting: Physical Therapy

## 2021-09-26 ENCOUNTER — Other Ambulatory Visit: Payer: Self-pay

## 2021-09-26 ENCOUNTER — Ambulatory Visit: Payer: Managed Care, Other (non HMO) | Admitting: Physical Therapy

## 2021-09-26 DIAGNOSIS — M542 Cervicalgia: Secondary | ICD-10-CM

## 2021-09-26 DIAGNOSIS — R293 Abnormal posture: Secondary | ICD-10-CM

## 2021-09-26 DIAGNOSIS — G4486 Cervicogenic headache: Secondary | ICD-10-CM

## 2021-09-26 DIAGNOSIS — R252 Cramp and spasm: Secondary | ICD-10-CM

## 2021-09-26 NOTE — Patient Instructions (Signed)

## 2021-09-26 NOTE — Therapy (Signed)
[facility] [address] [place], [place], [address] Phone: [phone]   Fax:  [phone]  Physical Therapy Treatment  Patient Details  Name: Sherry Gross MRN: [id number] Date of Birth: 1966/08/22 Referring Provider (PT): Melvenia Beam, MD   Encounter Date: 09/26/2021   PT End of Session - 09/26/21 0930     Visit Number 2    Number of Visits 13    Date for PT Re-Evaluation 11/04/21    Authorization Type [employer]    PT Start Time 0845    PT Stop Time 0925    PT Time Calculation (min) 40 min    Activity Tolerance Patient tolerated treatment well    Behavior During Therapy [facility] for tasks assessed/performed             Past Medical History:  Diagnosis Date   IBS (irritable bowel syndrome)    Renal cyst     Past Surgical History:  Procedure Laterality Date   LITHOTRIPSY      There were no vitals filed for this visit.   Subjective Assessment - 09/26/21 0846     Subjective Reports waking up with a HA. Ran 4 miles this AM.    Pertinent History migraines    Diagnostic tests 1120/22 brain MRI: Unremarkable noncontrast appearance of the brain for age.; 06/27/21 cervical xray: loss of lordosis.  Significant degenerative disc disease is present at C4-5 C5-6 and C6-7 with anterior osteophytes most prominent off of the C4 and C5 vertebral bodies.  Facet arthritis also moderate at these levels    Patient Stated Goals improve pain    Currently in Pain? Yes    Pain Score 6     Pain Location Shoulder    Pain Orientation Left    Pain Descriptors / Indicators Aching    Pain Radiating Towards radiating into HA to the crown of the head                               [facility] Adult PT Treatment/Exercise - 09/26/21 0001       Exercises   Exercises Neck;Shoulder      Neck Exercises: Seated   Neck Retraction 10 reps;3 secs    Neck Retraction Limitations 2nd set with yellow TB      Shoulder Exercises:  Supine   Horizontal ABduction Strengthening;Both;10 reps;Theraband    Theraband Level (Shoulder Horizontal ABduction) Level 2 (Red)    Horizontal ABduction Limitations doubled up red band; good form      Shoulder Exercises: Standing   Row Strengthening;Both;10 reps;Theraband    Theraband Level (Shoulder Row) Level 2 (Red)    Row Limitations cues to maintain elbows at sides      Shoulder Exercises: ROM/Strengthening   UBE (Upper Arm Bike) L2.5 x 4 min forward      Shoulder Exercises: Stretch   Other Shoulder Stretches R/L suboccipital stretch with self-OP 30"      Manual Therapy   Manual Therapy Soft tissue mobilization    Manual therapy comments skilled palpation and monitoring during TPDN              Trigger Point Dry Needling - 09/26/21 0001     Consent Given? Yes    Education Handout Provided Yes    Muscles Treated Head and Neck Upper trapezius;Suboccipitals;Levator scapulae    Dry Needling Comments prone with standard technique used; 20mm needles  Upper Trapezius Response Twitch reponse elicited;Palpable increased muscle length   L   Suboccipitals Response Twitch response elicited;Palpable increased muscle length   L   Levator Scapulae Response Twitch response elicited;Palpable increased muscle length   L                  PT Education - 09/26/21 0930     Education Details edu on DN benefits, precautions, side effects; advised to try moist heat to needled musculature to relieve HA; review of HEP    Person(s) Educated Patient    Methods Explanation;Demonstration;Tactile cues;Verbal cues;Handout    Comprehension Verbalized understanding;Returned demonstration              PT Short Term Goals - 09/26/21 1220       PT SHORT TERM GOAL #1   Title Patient to be independent with initial HEP.    Time 3    Period Weeks    Status Achieved    Target Date 10/14/21               PT Long Term Goals - 09/26/21 1221       PT LONG TERM GOAL #1    Title Patient to be independent with advanced HEP.    Time 6    Period Weeks    Status On-going    Target Date 11/04/21      PT LONG TERM GOAL #2   Title Patient to demonstrate cervical ROM WFL and without pain limiting.    Time 6    Period Weeks    Status On-going    Target Date 11/04/21      PT LONG TERM GOAL #3   Title Patient to demonstrate B UE strength >/=4+/5.    Time 6    Period Weeks    Status On-going    Target Date 11/04/21      PT LONG TERM GOAL #4   Title Patient to report and demonstrate improved head, neck, and shoulder posture at rest and with activity.    Time 6    Period Weeks    Status On-going    Target Date 11/04/21      PT LONG TERM GOAL #5   Title Patient to report 50% improvement in intensity/frequency of HAs.    Time 6    Period Weeks    Status On-going    Target Date 11/04/21      PT LONG TERM GOAL #6   Title Patient to report 70% improvement in neck/head pain when driving.    Time 6    Period Weeks    Status On-going    Target Date 11/04/21                   Plan - 09/26/21 1219     Clinical Impression Statement Patient arrived to session with report of HA starting at the L periscapular area and radiating into the L crown of the head. After providing edu on DN and obtaining verbal consent, patient tolerated DN to the L cervical musculature. Patient reported slight increase in HA after DN to the suboccipitals. Able to tolerate remainder of session without issues, including review of HEP for proper form. Patient with good ROM with cervical retraction; more trouble when adding light resistance. Overall patient tolerated session well. Reported understanding of DN side effects and without other complaints at end of session.    Comorbidities migraines    PT Treatment/Interventions ADLs/Self Care Home Management;Cryotherapy;Electrical Stimulation;DME Instruction;Traction;Moist Heat;Functional  mobility training;Therapeutic  activities;Therapeutic exercise;Neuromuscular re-education;Manual techniques;Patient/family education;Passive range of motion;Dry needling;Energy conservation;Vestibular;Canalith Repostioning;Taping    PT Next Visit Plan progress postural strengthening and cervical ROM    Consulted and Agree with Plan of Care Patient             Patient will benefit from skilled therapeutic intervention in order to improve the following deficits and impairments:  Decreased strength, Impaired sensation, Postural dysfunction, Improper body mechanics, Impaired flexibility, Decreased activity tolerance, Pain, Increased muscle spasms, Decreased range of motion, Increased fascial restricitons  Visit Diagnosis: Cervicogenic headache  Neck pain  Cramp and spasm  Abnormal posture     Problem List Patient Active Problem List   Diagnosis Date Noted   Migraine aura without headache 09/19/2021   Occipital neuralgia of left side 09/19/2021   Musculoskeletal disorder involving posterior cervical region 09/19/2021   DDD (degenerative disc disease), cervical 09/19/2021   Cervicogenic headache 09/19/2021   Chronic neck pain 09/19/2021   Cervical radiculopathy 09/19/2021   History of benign schwannoma 04/25/2019   BURSITIS, HIP 06/06/2010   HEEL PAIN 02/18/2010   ACUTE BRONCHITIS 11/30/2008   RIB PAIN, LEFT SIDED 11/30/2008   CHRONIC MAXILLARY SINUSITIS 01/31/2008   HEADACHE 01/31/2008   STRESS FRACTURE, FOOT 06/28/2007   ENTHESOPATHY, SITE NOS 01/18/2007   ANOREXIA NERVOSA 01/01/2007   HIP PAIN 01/01/2007   LEG LEGNTH DISCREPANCY 01/01/2007   Janene Harvey, PT, DPT 09/26/21 12:23 PM   [facility] [address] [place], [place], [address] Phone: [phone]   Fax:  [phone]  Name: Sherry Gross MRN: [id number] Date of Birth: 1966-06-06

## 2021-09-28 NOTE — Telephone Encounter (Signed)
[employer] did not approve the MRI Cervical spine.   "Based on their guidelines they cannot approve this request. Your healthcare provider told ust hat there is a concern related to your neck. The request cannot be approved because: imaging requires six weeks of provider directed treatment to be completed. Supported treatments include (but not limited to) drugs for swelling or pain, an in office workout (PT), and/or oral or injected steroids. This must have been completed in the past three months without improved symptoms. Contact (via office visit, phone, email or messaging) must occur after the treatment is completed."  I called [employer] to check to see when the deadline was for a peer to peer. They informed me there is no deadline it would just need to be called and scheduled.   The phone number is [phone] and the case number is [id number].

## 2021-10-04 ENCOUNTER — Encounter: Payer: Self-pay | Admitting: Physical Therapy

## 2021-10-04 ENCOUNTER — Ambulatory Visit: Payer: Managed Care, Other (non HMO) | Attending: Neurology | Admitting: Physical Therapy

## 2021-10-04 ENCOUNTER — Other Ambulatory Visit: Payer: Self-pay

## 2021-10-04 DIAGNOSIS — M542 Cervicalgia: Secondary | ICD-10-CM | POA: Insufficient documentation

## 2021-10-04 DIAGNOSIS — M545 Low back pain, unspecified: Secondary | ICD-10-CM | POA: Insufficient documentation

## 2021-10-04 DIAGNOSIS — R293 Abnormal posture: Secondary | ICD-10-CM | POA: Insufficient documentation

## 2021-10-04 DIAGNOSIS — G8929 Other chronic pain: Secondary | ICD-10-CM | POA: Insufficient documentation

## 2021-10-04 DIAGNOSIS — G4486 Cervicogenic headache: Secondary | ICD-10-CM | POA: Diagnosis not present

## 2021-10-04 DIAGNOSIS — R252 Cramp and spasm: Secondary | ICD-10-CM | POA: Insufficient documentation

## 2021-10-04 NOTE — Therapy (Signed)
[facility] [address], [place], [place], [address] Phone: [phone]   Fax:  [phone]  Physical Therapy Treatment  Patient Details  Name: Sherry Gross MRN: [id number] Date of Birth: September 09, 1965 Referring Provider (PT): Melvenia Beam, MD   Encounter Date: 10/04/2021   PT End of Session - 10/04/21 0929     Visit Number 3    Number of Visits 13    Date for PT Re-Evaluation 11/04/21    Authorization Type [employer]    PT Start Time [phone]    PT Stop Time 0925    PT Time Calculation (min) 39 min    Activity Tolerance Patient tolerated treatment well    Behavior During Therapy [facility] for tasks assessed/performed             Past Medical History:  Diagnosis Date   IBS (irritable bowel syndrome)    Renal cyst     Past Surgical History:  Procedure Laterality Date   LITHOTRIPSY      There were no vitals filed for this visit.   Subjective Assessment - 10/04/21 0847     Subjective Felt fine after DN, unsure if it helped as she is still having an "electric shock" sensation. R side of the neck has been bothering her like she has a "crick" in her neck for the past 3 days.    Pertinent History migraines    Diagnostic tests 1120/22 brain MRI: Unremarkable noncontrast appearance of the brain for age.; 06/27/21 cervical xray: loss of lordosis.  Significant degenerative disc disease is present at C4-5 C5-6 and C6-7 with anterior osteophytes most prominent off of the C4 and C5 vertebral bodies.  Facet arthritis also moderate at these levels    Patient Stated Goals improve pain    Currently in Pain? Yes    Pain Score 4     Pain Location Neck    Pain Orientation Left;Right    Pain Descriptors / Indicators Aching    Pain Type Chronic pain                               [facility] Adult PT Treatment/Exercise - 10/04/21 0001       Neck Exercises: Seated   Neck Retraction 10 reps;3 secs    Neck Retraction  Limitations cues to decrease ROM to avoid compensations      Shoulder Exercises: ROM/Strengthening   UBE (Upper Arm Bike) L2.5 x 2 min forward/back; dropped to L1.5 d/t c/o neck pain      Shoulder Exercises: Stretch   Other Shoulder Stretches R/L suboccipital stretch with self-OP 30"      Manual Therapy   Manual Therapy Soft tissue mobilization;Myofascial release    Manual therapy comments skilled palpation and monitoring during TPDN    Soft tissue mobilization STM to B suboccipitals and cervical paraspinals, temporalis    Myofascial Release TPR to suboccipitals, temporalis              Trigger Point Dry Needling - 10/04/21 0001     Education Handout Provided Yes    Muscles Treated Head and Neck Suboccipitals    Dry Needling Comments prone with standard technique used; 84mm needles    Other Dry Needling R splenius cervicis- twitch response and palpable improvement in muscle tension    Suboccipitals Response Twitch response elicited;Palpable increased muscle length   B  PT Education - 10/04/21 0929     Education Details discussion on exercise adjustment to improve R neck pain    Person(s) Educated Patient    Methods Explanation;Demonstration    Comprehension Verbalized understanding;Returned demonstration              PT Short Term Goals - 09/26/21 1220       PT SHORT TERM GOAL #1   Title Patient to be independent with initial HEP.    Time 3    Period Weeks    Status Achieved    Target Date 10/14/21               PT Long Term Goals - 09/26/21 1221       PT LONG TERM GOAL #1   Title Patient to be independent with advanced HEP.    Time 6    Period Weeks    Status On-going    Target Date 11/04/21      PT LONG TERM GOAL #2   Title Patient to demonstrate cervical ROM WFL and without pain limiting.    Time 6    Period Weeks    Status On-going    Target Date 11/04/21      PT LONG TERM GOAL #3   Title Patient to demonstrate  B UE strength >/=4+/5.    Time 6    Period Weeks    Status On-going    Target Date 11/04/21      PT LONG TERM GOAL #4   Title Patient to report and demonstrate improved head, neck, and shoulder posture at rest and with activity.    Time 6    Period Weeks    Status On-going    Target Date 11/04/21      PT LONG TERM GOAL #5   Title Patient to report 50% improvement in intensity/frequency of HAs.    Time 6    Period Weeks    Status On-going    Target Date 11/04/21      PT LONG TERM GOAL #6   Title Patient to report 70% improvement in neck/head pain when driving.    Time 6    Period Weeks    Status On-going    Target Date 11/04/21                   Plan - 10/04/21 0930     Clinical Impression Statement Patient arrived to session with report of no issues s/p DN last session but unsure of benefit. Also reports R sided pain near cervical paraspinals for the past couple of days. Reviewed cervical retractions and educated on decreasing intensity and resistance to avoid compensations and pain. Trialed DN again today after receiving verbal consent. Patient with good improvement in soft tissue restriction on B sides of neck after DN. Tolerated STM to further address remaining muscle tension in surrounding musculature. Reported slight HA after DN and STM, otherwise without complaints upon leaving.    Comorbidities migraines    PT Treatment/Interventions ADLs/Self Care Home Management;Cryotherapy;Electrical Stimulation;DME Instruction;Traction;Moist Heat;Functional mobility training;Therapeutic activities;Therapeutic exercise;Neuromuscular re-education;Manual techniques;Patient/family education;Passive range of motion;Dry needling;Energy conservation;Vestibular;Canalith Repostioning;Taping    PT Next Visit Plan progress postural strengthening and cervical ROM    Consulted and Agree with Plan of Care Patient             Patient will benefit from skilled therapeutic intervention  in order to improve the following deficits and impairments:  Decreased strength, Impaired sensation, Postural dysfunction, Improper body mechanics,  Impaired flexibility, Decreased activity tolerance, Pain, Increased muscle spasms, Decreased range of motion, Increased fascial restricitons  Visit Diagnosis: Cervicogenic headache  Neck pain  Cramp and spasm  Abnormal posture     Problem List Patient Active Problem List   Diagnosis Date Noted   Migraine aura without headache 09/19/2021   Occipital neuralgia of left side 09/19/2021   Musculoskeletal disorder involving posterior cervical region 09/19/2021   DDD (degenerative disc disease), cervical 09/19/2021   Cervicogenic headache 09/19/2021   Chronic neck pain 09/19/2021   Cervical radiculopathy 09/19/2021   History of benign schwannoma 04/25/2019   BURSITIS, HIP 06/06/2010   HEEL PAIN 02/18/2010   ACUTE BRONCHITIS 11/30/2008   RIB PAIN, LEFT SIDED 11/30/2008   CHRONIC MAXILLARY SINUSITIS 01/31/2008   HEADACHE 01/31/2008   STRESS FRACTURE, FOOT 06/28/2007   ENTHESOPATHY, SITE NOS 01/18/2007   ANOREXIA NERVOSA 01/01/2007   HIP PAIN 01/01/2007   LEG LEGNTH DISCREPANCY 01/01/2007    Janene Harvey, PT, DPT 10/04/21 9:31 AM   [facility] [address], [place], [place], [address] Phone: [phone]   Fax:  [phone]  Name: Sherry Gross MRN: [id number] Date of Birth: 05/25/1966

## 2021-10-10 ENCOUNTER — Encounter: Payer: Self-pay | Admitting: Physical Therapy

## 2021-10-10 ENCOUNTER — Other Ambulatory Visit: Payer: Self-pay | Admitting: Neurology

## 2021-10-10 ENCOUNTER — Ambulatory Visit: Payer: Managed Care, Other (non HMO) | Admitting: Physical Therapy

## 2021-10-10 ENCOUNTER — Other Ambulatory Visit: Payer: Self-pay

## 2021-10-10 ENCOUNTER — Encounter: Payer: Self-pay | Admitting: Neurology

## 2021-10-10 DIAGNOSIS — R252 Cramp and spasm: Secondary | ICD-10-CM

## 2021-10-10 DIAGNOSIS — G8929 Other chronic pain: Secondary | ICD-10-CM

## 2021-10-10 DIAGNOSIS — R293 Abnormal posture: Secondary | ICD-10-CM

## 2021-10-10 DIAGNOSIS — M542 Cervicalgia: Secondary | ICD-10-CM

## 2021-10-10 DIAGNOSIS — M545 Low back pain, unspecified: Secondary | ICD-10-CM

## 2021-10-10 DIAGNOSIS — G4486 Cervicogenic headache: Secondary | ICD-10-CM | POA: Diagnosis not present

## 2021-10-10 NOTE — Therapy (Signed)
[facility] [address] [place], [place], [address] Phone: [phone]   Fax:  [phone]  Physical Therapy Treatment  Patient Details  Name: RANJIT ROZYCKI MRN: [id number] Date of Birth: 27-Mar-1966 Referring Provider (PT): Anson Fret, MD   Encounter Date: 10/10/2021   PT End of Session - 10/10/21 0933     Visit Number 4    Number of Visits 13    Date for PT Re-Evaluation 11/04/21    Authorization Type [employer]    PT Start Time 0759    PT Stop Time 0845    PT Time Calculation (min) 46 min    Activity Tolerance Patient tolerated treatment well    Behavior During Therapy [facility] for tasks assessed/performed             Past Medical History:  Diagnosis Date   IBS (irritable bowel syndrome)    Renal cyst     Past Surgical History:  Procedure Laterality Date   LITHOTRIPSY      There were no vitals filed for this visit.   Subjective Assessment - 10/10/21 0800     Subjective Had a very bad HA during Thursday and Friday- had to take pain meds. R side of the neck is less painful than last week. Episodes of electric shock on the L side of the head are less profound but more frequent. Having some LBP recently.    Pertinent History migraines    Diagnostic tests 1120/22 brain MRI: Unremarkable noncontrast appearance of the brain for age.; 06/27/21 cervical xray: loss of lordosis.  Significant degenerative disc disease is present at C4-5 C5-6 and C6-7 with anterior osteophytes most prominent off of the C4 and C5 vertebral bodies.  Facet arthritis also moderate at these levels    Patient Stated Goals improve pain    Currently in Pain? Yes    Pain Score 7     Pain Location Back    Pain Orientation Lower    Pain Descriptors / Indicators Aching    Pain Type Acute pain                               [facility] Adult PT Treatment/Exercise - 10/10/21 0001       Neck Exercises: Supine   Neck Retraction 10  reps;3 secs    Neck Retraction Limitations with towel roll; 2x10   minimal movement   Other Supine Exercise DNF lift offs 10x      Shoulder Exercises: Supine   Horizontal ABduction Strengthening;Both;10 reps;Theraband    Theraband Level (Shoulder Horizontal ABduction) Level 2 (Red)    Horizontal ABduction Limitations doubled up red band; good form; 2x10    External Rotation Strengthening;Both;10 reps;Theraband    External Rotation Limitations 2x10      Shoulder Exercises: Standing   Extension Strengthening;Both;15 reps;Theraband    Theraband Level (Shoulder Extension) Level 2 (Red)    Row Strengthening;Both;Theraband;15 reps    Theraband Level (Shoulder Row) Level 3 (Green)    Row Limitations good form      Manual Therapy   Manual Therapy Soft tissue mobilization;Myofascial release    Manual therapy comments skilled palpation and monitoring during TPDN              Trigger Point Dry Needling - 10/10/21 0001     Education Handout Provided Yes    Muscles Treated Head and Neck Suboccipitals;Levator scapulae    Dry  Needling Comments prone with standard technique used; 30 and 36mm needles    Suboccipitals Response Twitch response elicited;Palpable increased muscle length   B   Levator Scapulae Response Twitch response elicited;Palpable increased muscle length   B                  PT Education - 10/10/21 0932     Education Details discussion on remainder of POC and update to HEP-Access Code: [email or web address]    Person(s) Educated Patient    Methods Explanation;Demonstration;Tactile cues;Handout;Verbal cues    Comprehension Verbalized understanding;Returned demonstration              PT Short Term Goals - 09/26/21 1220       PT SHORT TERM GOAL #1   Title Patient to be independent with initial HEP.    Time 3    Period Weeks    Status Achieved    Target Date 10/14/21               PT Long Term Goals - 09/26/21 1221       PT LONG TERM GOAL #1   Title  Patient to be independent with advanced HEP.    Time 6    Period Weeks    Status On-going    Target Date 11/04/21      PT LONG TERM GOAL #2   Title Patient to demonstrate cervical ROM WFL and without pain limiting.    Time 6    Period Weeks    Status On-going    Target Date 11/04/21      PT LONG TERM GOAL #3   Title Patient to demonstrate B UE strength >/=4+/5.    Time 6    Period Weeks    Status On-going    Target Date 11/04/21      PT LONG TERM GOAL #4   Title Patient to report and demonstrate improved head, neck, and shoulder posture at rest and with activity.    Time 6    Period Weeks    Status On-going    Target Date 11/04/21      PT LONG TERM GOAL #5   Title Patient to report 50% improvement in intensity/frequency of HAs.    Time 6    Period Weeks    Status On-going    Target Date 11/04/21      PT LONG TERM GOAL #6   Title Patient to report 70% improvement in neck/head pain when driving.    Time 6    Period Weeks    Status On-going    Target Date 11/04/21                   Plan - 10/10/21 0934     Clinical Impression Statement Patient arrived to session with report of improvement in R sided neck pain. Notes that episodes of electric shock on the L side of the head are less intense but more frequent. Also notes some LBP today. After receiving verbal consent for DN, patient tolerated DN to cervical musculature. Proceeded with periscapular strengthening with cueing to depress shoulders. Patient with small amplitude ROM evident with supine cervical retractions which improved with practice. Updated HEP with updated TB resistance and periscapular strengthening activity which was well-tolerated today. Patient reported understanding and without complaints at end of session.    Comorbidities migraines    PT Treatment/Interventions ADLs/Self Care Home Management;Cryotherapy;Electrical Stimulation;DME Instruction;Traction;Moist Heat;Functional mobility  training;Therapeutic activities;Therapeutic exercise;Neuromuscular re-education;Manual techniques;Patient/family education;Passive range of motion;Dry needling;Energy  conservation;Vestibular;Canalith Repostioning;Taping    PT Next Visit Plan progress postural strengthening and cervical ROM    Consulted and Agree with Plan of Care Patient             Patient will benefit from skilled therapeutic intervention in order to improve the following deficits and impairments:  Decreased strength, Impaired sensation, Postural dysfunction, Improper body mechanics, Impaired flexibility, Decreased activity tolerance, Pain, Increased muscle spasms, Decreased range of motion, Increased fascial restricitons  Visit Diagnosis: Cervicogenic headache  Neck pain  Cramp and spasm  Abnormal posture     Problem List Patient Active Problem List   Diagnosis Date Noted   Migraine aura without headache 09/19/2021   Occipital neuralgia of left side 09/19/2021   Musculoskeletal disorder involving posterior cervical region 09/19/2021   DDD (degenerative disc disease), cervical 09/19/2021   Cervicogenic headache 09/19/2021   Chronic neck pain 09/19/2021   Cervical radiculopathy 09/19/2021   History of benign schwannoma 04/25/2019   BURSITIS, HIP 06/06/2010   HEEL PAIN 02/18/2010   ACUTE BRONCHITIS 11/30/2008   RIB PAIN, LEFT SIDED 11/30/2008   CHRONIC MAXILLARY SINUSITIS 01/31/2008   HEADACHE 01/31/2008   STRESS FRACTURE, FOOT 06/28/2007   ENTHESOPATHY, SITE NOS 01/18/2007   ANOREXIA NERVOSA 01/01/2007   HIP PAIN 01/01/2007   LEG LEGNTH DISCREPANCY 01/01/2007    Anette Guarneri, PT, DPT 10/10/21 9:35 AM    [facility] [address], [address] [place], [place], [address] Phone: [phone]   Fax:  [phone]  Name: KIEU QUIGGLE MRN: [id number] Date of Birth: 09/07/1965

## 2021-10-10 NOTE — Progress Notes (Signed)
p 

## 2021-10-11 ENCOUNTER — Ambulatory Visit: Payer: Managed Care, Other (non HMO) | Admitting: Physical Therapy

## 2021-10-17 ENCOUNTER — Ambulatory Visit: Payer: Managed Care, Other (non HMO) | Admitting: Physical Therapy

## 2021-10-17 ENCOUNTER — Other Ambulatory Visit: Payer: Self-pay

## 2021-10-17 ENCOUNTER — Encounter: Payer: Self-pay | Admitting: Physical Therapy

## 2021-10-17 DIAGNOSIS — R252 Cramp and spasm: Secondary | ICD-10-CM

## 2021-10-17 DIAGNOSIS — M542 Cervicalgia: Secondary | ICD-10-CM

## 2021-10-17 DIAGNOSIS — G4486 Cervicogenic headache: Secondary | ICD-10-CM

## 2021-10-17 DIAGNOSIS — G8929 Other chronic pain: Secondary | ICD-10-CM

## 2021-10-17 DIAGNOSIS — R293 Abnormal posture: Secondary | ICD-10-CM

## 2021-10-17 NOTE — Therapy (Signed)
[facility] [address], [address] [place], [place], [address] Phone: [phone]   Fax:  [phone]  Physical Therapy Treatment  Patient Details  Name: Sherry Gross MRN: [id number] Date of Birth: May 06, 1966 Referring Provider (PT): Anson Fret, MD   Encounter Date: 10/17/2021   PT End of Session - 10/17/21 0933     Visit Number 5    Number of Visits 13    Date for PT Re-Evaluation 11/14/21    Authorization Type [employer]    PT Start Time [phone]    PT Stop Time 0929    PT Time Calculation (min) 43 min    Activity Tolerance Patient tolerated treatment well;Patient limited by pain    Behavior During Therapy [facility] for tasks assessed/performed             Past Medical History:  Diagnosis Date   IBS (irritable bowel syndrome)    Renal cyst     Past Surgical History:  Procedure Laterality Date   LITHOTRIPSY      There were no vitals filed for this visit.   Subjective Assessment - 10/17/21 0845     Subjective Patient reports R sided LBP since August without specific injury. Pain worse on R>L side and worse with prolonged forward bending, making it hard to stand back up; pain with extension, worse in the AM. Denies N/T, radiation. Reports that this first began with pain in the anterior hip- found to have B inguinal hernia. This pain still bothers her, but the back is worse now. Reports that she had an MRI which showed a prolapsed pelvic floor; believes this is what causes recent increased urge/frequency of urination. Not taking pain meds d/t hx of IBS, but Advil does help, heating pad. Runs daily- 1 mile on elliptical, running 1.5-4.5 miles on treadmill. No pain while running.    Pertinent History migraines    Diagnostic tests 1120/22 brain MRI: Unremarkable noncontrast appearance of the brain for age.; 06/27/21 cervical xray: loss of lordosis.  Significant degenerative disc disease is present at C4-5 C5-6 and C6-7 with anterior  osteophytes most prominent off of the C4 and C5 vertebral bodies.  Facet arthritis also moderate at these levels    Patient Stated Goals improve pain    Currently in Pain? Yes    Pain Score 4     Pain Location Neck    Pain Orientation Right    Pain Descriptors / Indicators Aching    Pain Type Acute pain    Multiple Pain Sites Yes    Pain Score 8    Pain Location Back    Pain Orientation Right;Left    Pain Descriptors / Indicators Constant;Sharp    Pain Type Acute pain                [facility] PT Assessment - 10/17/21 0855       Assessment   Medical Diagnosis Chronic low back pain, unspecified back pain laterality, unspecified whether sciatica present    Referring Provider (PT) Anson Fret, MD    Onset Date/Surgical Date 03/28/21      Sensation   Light Touch Appears Intact      Posture/Postural Control   Posture/Postural Control Postural limitations    Postural Limitations Forward head;Posterior pelvic tilt      AROM   AROM Assessment Site Hip;Lumbar    Right/Left Hip Right;Left    Right Hip Flexion 130   anterior hip pain   Left Hip  Flexion 129    Lumbar Flexion ankles   pain coming up to neutral   Lumbar Extension to neutral   pain and stiffness   Lumbar - Right Side Bend distal thigh    Lumbar - Left Side Bend distal thigh    Lumbar - Right Rotation 50% limited    Lumbar - Left Rotation 50% limited      Strength   Strength Assessment Site Hip;Knee;Ankle    Right/Left Hip Right;Left    Right Hip Flexion 4/5   pain in anterior hip/groin   Right Hip Extension 4+/5    Right Hip ABduction 4/5    Right Hip ADduction 3+/5   limited ROM   Left Hip Flexion 4+/5    Left Hip Extension 4+/5    Left Hip ABduction 4/5    Left Hip ADduction 3+/5   limited ROM   Right/Left Knee Right;Left    Right Knee Flexion 4/5    Right Knee Extension 4+/5    Left Knee Flexion 4+/5    Left Knee Extension 5/5    Right/Left Ankle Right;Left    Right Ankle Dorsiflexion 4/5     Right Ankle Plantar Flexion 4+/5   20 reps with limited ROM   Left Ankle Dorsiflexion 4+/5    Left Ankle Plantar Flexion 4+/5   20 reps with limited ROM     Flexibility   Soft Tissue Assessment /Muscle Length yes    Quadriceps B mod thomas WFL    Piriformis pain in R anterior hip and LB with KTOS and fig 4; WFL on L LE      Palpation   Spinal mobility pain with gentle central PA over L1    Palpation comment soft tissue restriction and TTP in L glute max, med, piriformis, R iliacus, iliopsoas; also tight in L iliacus, iliopsoas but nonpainful      Ambulation/Gait   Assistive device None    Gait Pattern Step-through pattern;Decreased step length - right;Decreased step length - left;Poor foot clearance - left;Poor foot clearance - right    Ambulation Surface Level;Indoor    Gait velocity decreased                                    PT Education - 10/17/21 0932     Education Details HEP for LBP- Access Code: [email or web address]; prognosis, POC    Person(s) Educated Patient    Methods Explanation;Demonstration;Tactile cues;Verbal cues;Handout    Comprehension Verbalized understanding              PT Short Term Goals - 10/17/21 1128       PT SHORT TERM GOAL #1   Title Patient to be independent with initial HEP.    Time 3    Period Weeks    Status Achieved    Target Date 10/14/21               PT Long Term Goals - 10/17/21 1124       PT LONG TERM GOAL #1   Title Patient to be independent with advanced HEP.    Time 4    Period Weeks    Status On-going    Target Date 11/14/21      PT LONG TERM GOAL #2   Title Patient to demonstrate cervical ROM WFL and without pain limiting.    Time 4    Period Weeks    Status On-going  Target Date 11/14/21      PT LONG TERM GOAL #3   Title Patient to demonstrate B UE and LE strength >/=4+/5.    Time 4    Period Weeks    Status On-going    Target Date 11/14/21      PT LONG TERM GOAL #4   Title  Patient to report and demonstrate improved head, neck, and shoulder posture at rest and with activity.    Time 4    Period Weeks    Status On-going    Target Date 11/14/21      PT LONG TERM GOAL #5   Title Patient to report 50% improvement in intensity/frequency of HAs.    Time 4    Period Weeks    Status On-going    Target Date 11/14/21      Additional Long Term Goals   Additional Long Term Goals Yes      PT LONG TERM GOAL #6   Title Patient to report 70% improvement in neck/head pain when driving.    Time 4    Period Weeks    Status On-going    Target Date 11/14/21      PT LONG TERM GOAL #7   Title Patient to demonstrate lumbar AROM WFL and without pain limiting.    Time 4    Status New    Target Date 11/14/21      PT LONG TERM GOAL #8   Title Patient to report 50% improvement in LBP with bending to care for her horses.    Time 4    Period Weeks    Status New    Target Date 11/14/21                   Plan - 10/17/21 1120     Clinical Impression Statement Assessment for LBP performed today d/t receiving new referral. Patient reports insidious R>L LBP since August. Worse with coming up from forward flexion, extension, and in the AM. Denies N/T, radiation. Also reports B groin pain; notes hx of B inguinal hernias. Patient continues to run daily but denies pain while running. Patient today demonstrated limited and painful hip flexion AROM, limited and painful lumbar AROM, decreased R>L LE strength, pain with R hip stretching, pain with central PA over L1, soft tissue restriction and TTP in L glute max, med, piriformis, R iliacus, iliopsoas; also tight in L iliacus and iliopsoas but nonpainful, and gait deviations. Patient was educated on gentle strengthening and mobility HEP and reported understanding. Would benefit from continued skilled PT services to address new and remaining goals.    Comorbidities migraines    PT Frequency Other (comment)   1-2x/week   PT  Duration 4 weeks    PT Treatment/Interventions ADLs/Self Care Home Management;Cryotherapy;Electrical Stimulation;DME Instruction;Traction;Moist Heat;Functional mobility training;Therapeutic activities;Therapeutic exercise;Neuromuscular re-education;Manual techniques;Patient/family education;Passive range of motion;Dry needling;Energy conservation;Vestibular;Canalith Repostioning;Taping;Gait training;Stair training;Balance training    PT Next Visit Plan progress postural strengthening and cervical ROM; reassess LB HEP    Consulted and Agree with Plan of Care Patient             Patient will benefit from skilled therapeutic intervention in order to improve the following deficits and impairments:  Decreased strength, Impaired sensation, Postural dysfunction, Improper body mechanics, Impaired flexibility, Decreased activity tolerance, Pain, Increased muscle spasms, Decreased range of motion, Increased fascial restricitons  Visit Diagnosis: Cervicogenic headache  Neck pain  Cramp and spasm  Abnormal posture  Chronic bilateral low back pain  without sciatica     Problem List Patient Active Problem List   Diagnosis Date Noted   Migraine aura without headache 09/19/2021   Occipital neuralgia of left side 09/19/2021   Musculoskeletal disorder involving posterior cervical region 09/19/2021   DDD (degenerative disc disease), cervical 09/19/2021   Cervicogenic headache 09/19/2021   Chronic neck pain 09/19/2021   Cervical radiculopathy 09/19/2021   History of benign schwannoma 04/25/2019   BURSITIS, HIP 06/06/2010   HEEL PAIN 02/18/2010   ACUTE BRONCHITIS 11/30/2008   RIB PAIN, LEFT SIDED 11/30/2008   CHRONIC MAXILLARY SINUSITIS 01/31/2008   HEADACHE 01/31/2008   STRESS FRACTURE, FOOT 06/28/2007   ENTHESOPATHY, SITE NOS 01/18/2007   ANOREXIA NERVOSA 01/01/2007   HIP PAIN 01/01/2007   LEG LEGNTH DISCREPANCY 01/01/2007    Anette Guarneri, PT, DPT 10/17/21 11:31 AM   [facility] [address] [place], [place], [address] Phone: [phone]   Fax:  [phone]  Name: Sherry Gross MRN: [id number] Date of Birth: 1965-12-24

## 2021-10-17 NOTE — Therapy (Deleted)
[facility] [address], [address] [place], [place], [address] Phone: [phone]   Fax:  [phone]  Physical Therapy Treatment  Patient Details  Name: Sherry Gross MRN: [id number] Date of Birth: August 10, 1966 Referring Provider (PT): Anson Fret, MD   Encounter Date: 10/17/2021   PT End of Session - 10/17/21 0933     Visit Number 5    Number of Visits 13    Date for PT Re-Evaluation 11/04/21    Authorization Type [employer]    PT Start Time [phone]    PT Stop Time 0929    PT Time Calculation (min) 43 min    Activity Tolerance Patient tolerated treatment well;Patient limited by pain    Behavior During Therapy [facility] for tasks assessed/performed             Past Medical History:  Diagnosis Date   IBS (irritable bowel syndrome)    Renal cyst     Past Surgical History:  Procedure Laterality Date   LITHOTRIPSY      There were no vitals filed for this visit.   Subjective Assessment - 10/17/21 0845     Subjective Patient reports R sided LBP since August without specific injury. Pain worse on R>L side and worse with prolonged forward bending, making it hard to stand back up; pain with extension, worse in the AM. Denies N/T, radiation. Reports that this first began with pain in the anterior hip- found to have B inguinal hernia. This pain still bothers her, but the back is worse now. Reports that she had an MRI which showed a prolapsed pelvic floor; believes this is what causes recent increased urge/frequency of urination. Not taking pain meds d/t hx of IBS, but Advil does help, heating pad. Runs daily- 1 mile on elliptical, running 1.5-4.5 miles on treadmill. No pain while running.    Pertinent History migraines    Diagnostic tests 1120/22 brain MRI: Unremarkable noncontrast appearance of the brain for age.; 06/27/21 cervical xray: loss of lordosis.  Significant degenerative disc disease is present at C4-5 C5-6 and C6-7 with anterior  osteophytes most prominent off of the C4 and C5 vertebral bodies.  Facet arthritis also moderate at these levels    Patient Stated Goals improve pain    Currently in Pain? Yes    Pain Score 4     Pain Location Neck    Pain Orientation Right    Pain Descriptors / Indicators Aching    Pain Type Acute pain    Multiple Pain Sites Yes    Pain Score 8    Pain Location Back    Pain Orientation Right;Left    Pain Descriptors / Indicators Constant;Sharp    Pain Type Acute pain                [facility] PT Assessment - 10/17/21 0855       Assessment   Medical Diagnosis Chronic low back pain, unspecified back pain laterality, unspecified whether sciatica present    Referring Provider (PT) Anson Fret, MD    Onset Date/Surgical Date 03/28/21      Sensation   Light Touch Appears Intact      Posture/Postural Control   Posture/Postural Control Postural limitations    Postural Limitations Forward head;Posterior pelvic tilt      AROM   AROM Assessment Site Hip;Lumbar    Right/Left Hip Right;Left    Right Hip Flexion 130   anterior hip pain   Left Hip  Flexion 129    Lumbar Flexion ankles   pain coming up to neutral   Lumbar Extension to neutral   pain and stiffness   Lumbar - Right Side Bend distal thigh    Lumbar - Left Side Bend distal thigh    Lumbar - Right Rotation 50% limited    Lumbar - Left Rotation 50% limited      Strength   Strength Assessment Site Hip;Knee;Ankle    Right/Left Hip Right;Left    Right Hip Flexion 4/5   pain in anterior hip/groin   Right Hip Extension 4+/5    Right Hip ABduction 4/5    Right Hip ADduction 3+/5   limited ROM   Left Hip Flexion 4+/5    Left Hip Extension 4+/5    Left Hip ABduction 4/5    Left Hip ADduction 3+/5   limited ROM   Right/Left Knee Right;Left    Right Knee Flexion 4/5    Right Knee Extension 4+/5    Left Knee Flexion 4+/5    Left Knee Extension 5/5    Right/Left Ankle Right;Left    Right Ankle Dorsiflexion 4/5     Right Ankle Plantar Flexion 4+/5   20 reps with limited ROM   Left Ankle Dorsiflexion 4+/5    Left Ankle Plantar Flexion 4+/5   20 reps with limited ROM     Flexibility   Soft Tissue Assessment /Muscle Length yes    Quadriceps B mod thomas WFL    Piriformis pain in R anterior hip and LB with KTOS and fig 4; WFL on L LE      Palpation   Spinal mobility pain with gentle central PA over L1    Palpation comment soft tissue restriction and TTP in L glute max, med, piriformis, R iliacus, iliopsoas; also tight in L iliacus, iliopsoas but nonpainful      Ambulation/Gait   Assistive device None    Gait Pattern Step-through pattern;Decreased step length - right;Decreased step length - left;Poor foot clearance - left;Poor foot clearance - right    Ambulation Surface Level;Indoor    Gait velocity decreased                                    PT Education - 10/17/21 0932     Education Details HEP for LBP- Access Code: [email or web address]; prognosis, POC    Person(s) Educated Patient    Methods Explanation;Demonstration;Tactile cues;Verbal cues;Handout    Comprehension Verbalized understanding              PT Short Term Goals - 09/26/21 1220       PT SHORT TERM GOAL #1   Title Patient to be independent with initial HEP.    Time 3    Period Weeks    Status Achieved    Target Date 10/14/21               PT Long Term Goals - 10/17/21 1124       PT LONG TERM GOAL #1   Title Patient to be independent with advanced HEP.    Time 6    Period Weeks    Status On-going    Target Date 11/04/21      PT LONG TERM GOAL #2   Title Patient to demonstrate cervical ROM WFL and without pain limiting.    Time 6    Period Weeks    Status On-going  Target Date 11/04/21      PT LONG TERM GOAL #3   Title Patient to demonstrate B UE and LE strength >/=4+/5.    Time 6    Period Weeks    Status On-going    Target Date 11/04/21      PT LONG TERM GOAL #4   Title  Patient to report and demonstrate improved head, neck, and shoulder posture at rest and with activity.    Time 6    Period Weeks    Status On-going    Target Date 11/04/21      PT LONG TERM GOAL #5   Title Patient to report 50% improvement in intensity/frequency of HAs.    Time 6    Period Weeks    Status On-going    Target Date 11/04/21      Additional Long Term Goals   Additional Long Term Goals Yes      PT LONG TERM GOAL #6   Title Patient to report 70% improvement in neck/head pain when driving.    Time 6    Period Weeks    Status On-going    Target Date 11/04/21      PT LONG TERM GOAL #7   Title Patient to demonstrate lumbar AROM WFL and without pain limiting.    Status New    Target Date 11/04/21      PT LONG TERM GOAL #8   Title Patient to report 50% improvement in LBP with bending to care for her horses.    Period Weeks    Status New    Target Date 11/04/20                   Plan - 10/17/21 1120     Clinical Impression Statement Assessment for LBP performed today d/t receiving new referral. Patient reports insidious R>L LBP since August. Worse with coming up from forward flexion, extension, and in the AM. Denies N/T, radiation. Also reports B groin pain; notes hx of B inguinal hernias. Patient continues to run daily but denies pain while running. Patient today demonstrated limited and painful hip flexion AROM, limited and painful lumbar AROM, decreased R>L LE strength, pain with R hip stretching, pain with central PA over L1, soft tissue restriction and TTP in L glute max, med, piriformis, R iliacus, iliopsoas; also tight in L iliacus and iliopsoas but nonpainful, and gait deviations. Patient was educated on gentle strengthening and mobility HEP and reported understanding. Would benefit from continued skilled PT services to address new and remaining goals.    Comorbidities migraines    PT Treatment/Interventions ADLs/Self Care Home  Management;Cryotherapy;Electrical Stimulation;DME Instruction;Traction;Moist Heat;Functional mobility training;Therapeutic activities;Therapeutic exercise;Neuromuscular re-education;Manual techniques;Patient/family education;Passive range of motion;Dry needling;Energy conservation;Vestibular;Canalith Repostioning;Taping;Gait training;Stair training;Balance training    PT Next Visit Plan progress postural strengthening and cervical ROM; reassess LB HEP    Consulted and Agree with Plan of Care Patient             Patient will benefit from skilled therapeutic intervention in order to improve the following deficits and impairments:  Decreased strength, Impaired sensation, Postural dysfunction, Improper body mechanics, Impaired flexibility, Decreased activity tolerance, Pain, Increased muscle spasms, Decreased range of motion, Increased fascial restricitons  Visit Diagnosis: Cervicogenic headache  Neck pain  Cramp and spasm  Abnormal posture     Problem List Patient Active Problem List   Diagnosis Date Noted   Migraine aura without headache 09/19/2021   Occipital neuralgia of left side 09/19/2021  Musculoskeletal disorder involving posterior cervical region 09/19/2021   DDD (degenerative disc disease), cervical 09/19/2021   Cervicogenic headache 09/19/2021   Chronic neck pain 09/19/2021   Cervical radiculopathy 09/19/2021   History of benign schwannoma 04/25/2019   BURSITIS, HIP 06/06/2010   HEEL PAIN 02/18/2010   ACUTE BRONCHITIS 11/30/2008   RIB PAIN, LEFT SIDED 11/30/2008   CHRONIC MAXILLARY SINUSITIS 01/31/2008   HEADACHE 01/31/2008   STRESS FRACTURE, FOOT 06/28/2007   ENTHESOPATHY, SITE NOS 01/18/2007   ANOREXIA NERVOSA 01/01/2007   HIP PAIN 01/01/2007   LEG LEGNTH DISCREPANCY 01/01/2007    Tyrone SageJane D Desten Manor, PT 10/17/2021, 11:26 AM  [facility] [address] [place], [place], [address] Phone: [phone]   Fax:   [phone]  Name: Sherry Gross MRN: [id number] Date of Birth: [birth year]

## 2021-10-21 ENCOUNTER — Encounter: Payer: Self-pay | Admitting: Physical Therapy

## 2021-10-21 ENCOUNTER — Other Ambulatory Visit: Payer: Self-pay

## 2021-10-21 ENCOUNTER — Ambulatory Visit: Payer: Managed Care, Other (non HMO) | Admitting: Physical Therapy

## 2021-10-21 DIAGNOSIS — M542 Cervicalgia: Secondary | ICD-10-CM

## 2021-10-21 DIAGNOSIS — G4486 Cervicogenic headache: Secondary | ICD-10-CM

## 2021-10-21 DIAGNOSIS — G8929 Other chronic pain: Secondary | ICD-10-CM

## 2021-10-21 DIAGNOSIS — R293 Abnormal posture: Secondary | ICD-10-CM

## 2021-10-21 DIAGNOSIS — R252 Cramp and spasm: Secondary | ICD-10-CM

## 2021-10-21 NOTE — Therapy (Addendum)
[facility] [address], [address] [place], [place], [address] Phone: [phone]   Fax:  [phone]  Physical Therapy Treatment  Patient Details  Name: Sherry Gross MRN: [id number] Date of Birth: [birth year] Referring Provider (PT): Anson Fret, MD   Encounter Date: 10/21/2021   PT End of Session - 10/21/21 1113     Visit Number 6    Number of Visits 13    Date for PT Re-Evaluation 11/14/21    Authorization Type [employer]    PT Start Time 0849    PT Stop Time 0929    PT Time Calculation (min) 40 min    Activity Tolerance Patient tolerated treatment well;Patient limited by pain    Behavior During Therapy [facility] for tasks assessed/performed             Past Medical History:  Diagnosis Date   IBS (irritable bowel syndrome)    Renal cyst     Past Surgical History:  Procedure Laterality Date   LITHOTRIPSY      There were no vitals filed for this visit.   Subjective Assessment - 10/21/21 0845     Subjective Had a pretty bad HA the other day and a little one on the L side today. Back has been pretty bad. Brought in old lumbar xrays from 2012 and 2013 which show mild lumbar disc herniations and thoracic compression fxs.    Pertinent History migraines    Diagnostic tests 1120/22 brain MRI: Unremarkable noncontrast appearance of the brain for age.; 06/27/21 cervical xray: loss of lordosis.  Significant degenerative disc disease is present at C4-5 C5-6 and C6-7 with anterior osteophytes most prominent off of the C4 and C5 vertebral bodies.  Facet arthritis also moderate at these levels    Patient Stated Goals improve pain    Currently in Pain? Yes    Pain Score 3     Pain Location Head    Pain Orientation Right;Left    Pain Descriptors / Indicators Aching    Pain Type Chronic pain    Pain Score 8    Pain Location Back    Pain Orientation Right    Pain Descriptors / Indicators Constant;Sharp    Pain Type Acute pain                 [facility] PT Assessment - 10/21/21 0001       AROM   Right Hip External Rotation  36   anterior hip pain   Right Hip Internal Rotation  31   anterior hip pain   Left Hip External Rotation  45    Left Hip Internal Rotation  24      Strength   Right Hip External Rotation  4+/5    Right Hip Internal Rotation 4-/5    Left Hip External Rotation 4+/5    Left Hip Internal Rotation 4/5                           [facility] Adult PT Treatment/Exercise - 10/21/21 0001       Exercises   Exercises Lumbar      Lumbar Exercises: Stretches   Piriformis Stretch Right;Left;2 reps;20 seconds    Piriformis Stretch Limitations gentle KTOS and fig 4 with patient OP   cues to avoid pelvic rotation     Lumbar Exercises: Supine   Pelvic Tilt 10 reps    Pelvic Tilt Limitations limited amplitude; cues for  form    Bridge with [employer] 10 reps    Bridge with [employer] Limitations good amplitude    Other Supine Lumbar Exercises hooklying resisted march with yellow loop x10, without TB 10x   c/o R anterior hip pain- discontonued with TB   Other Supine Lumbar Exercises overhead red medball lift with posterior pelvic tilt 2x10      Lumbar Exercises: Sidelying   Hip Abduction Right;Left;10 reps    Hip Abduction Weights (lbs) 2    Hip Abduction Limitations manual cueing for proper alignment    Other Sidelying Lumbar Exercises R/L hip adduction with 2# 10x                     PT Education - 10/21/21 1113     Education Details update to HEP- Access Code: [email or web address]    Person(s) Educated Patient    Methods Explanation;Demonstration;Tactile cues;Handout;Verbal cues    Comprehension Verbalized understanding;Returned demonstration              PT Short Term Goals - 10/17/21 1128       PT SHORT TERM GOAL #1   Title Patient to be independent with initial HEP.    Time 3    Period Weeks    Status Achieved    Target Date 10/14/21               PT Long  Term Goals - 10/21/21 1116       PT LONG TERM GOAL #1   Title Patient to be independent with advanced HEP.    Time 4    Period Weeks    Status On-going    Target Date 11/14/21      PT LONG TERM GOAL #2   Title Patient to demonstrate cervical ROM WFL and without pain limiting.    Time 4    Period Weeks    Status On-going    Target Date 11/14/21      PT LONG TERM GOAL #3   Title Patient to demonstrate B UE and LE strength >/=4+/5.    Time 4    Period Weeks    Status On-going    Target Date 11/14/21      PT LONG TERM GOAL #4   Title Patient to report and demonstrate improved head, neck, and shoulder posture at rest and with activity.    Time 4    Period Weeks    Status On-going    Target Date 11/14/21      PT LONG TERM GOAL #5   Title Patient to report 50% improvement in intensity/frequency of HAs.    Time 4    Period Weeks    Status On-going    Target Date 11/14/21      PT LONG TERM GOAL #6   Title Patient to report 70% improvement in neck/head pain when driving.    Time 4    Period Weeks    Status On-going    Target Date 11/14/21      PT LONG TERM GOAL #7   Title Patient to demonstrate lumbar AROM WFL and without pain limiting.    Time 4    Status On-going    Target Date 11/14/21      PT LONG TERM GOAL #8   Title Patient to report 50% improvement in LBP with bending to care for her horses.    Time 4    Period Weeks    Status On-going    Target  Date 11/14/21                   Plan - 10/21/21 1113     Clinical Impression Statement Patient arrived to session with report of continued HAs and back pain. Brought in lumbar x-ray results from 2012 and 2013 which showed mild lumbar disc herniations and thoracic compression fxs. B hip AROM and strength was tested further which revealed weakness in hip IR and decreased R hip ER, L hip IR AROM. Patient performed hip and core strengthening activities with spine in neutral. Limited by R hip/abdominal pain with  gentle hip flexor strengthening, thus this was discontinued. Was able to tolerate gentle hip stretching, thus update this into HEP. Educated patient on activity modification d/t possibility of daily running contributing to groin/abdominal, and to instead try stationary bike. Patient reported understanding. No complaints at end of session.    Comorbidities migraines    PT Frequency Other (comment)   1-2x/week   PT Duration 4 weeks    PT Treatment/Interventions ADLs/Self Care Home Management;Cryotherapy;Electrical Stimulation;DME Instruction;Traction;Moist Heat;Functional mobility training;Therapeutic activities;Therapeutic exercise;Neuromuscular re-education;Manual techniques;Patient/family education;Passive range of motion;Dry needling;Energy conservation;Vestibular;Canalith Repostioning;Taping;Gait training;Stair training;Balance training    PT Next Visit Plan progress postural strengthening and cervical ROM, lumbar AROM, hip strength    Consulted and Agree with Plan of Care Patient             Patient will benefit from skilled therapeutic intervention in order to improve the following deficits and impairments:  Decreased strength, Impaired sensation, Postural dysfunction, Improper body mechanics, Impaired flexibility, Decreased activity tolerance, Pain, Increased muscle spasms, Decreased range of motion, Increased fascial restricitons  Visit Diagnosis: Cervicogenic headache  Neck pain  Cramp and spasm  Abnormal posture  Chronic bilateral low back pain without sciatica     Problem List Patient Active Problem List   Diagnosis Date Noted   Migraine aura without headache 09/19/2021   Occipital neuralgia of left side 09/19/2021   Musculoskeletal disorder involving posterior cervical region 09/19/2021   DDD (degenerative disc disease), cervical 09/19/2021   Cervicogenic headache 09/19/2021   Chronic neck pain 09/19/2021   Cervical radiculopathy 09/19/2021   History of benign  schwannoma 04/25/2019   BURSITIS, HIP 06/06/2010   HEEL PAIN 02/18/2010   ACUTE BRONCHITIS 11/30/2008   RIB PAIN, LEFT SIDED 11/30/2008   CHRONIC MAXILLARY SINUSITIS 01/31/2008   HEADACHE 01/31/2008   STRESS FRACTURE, FOOT 06/28/2007   ENTHESOPATHY, SITE NOS 01/18/2007   ANOREXIA NERVOSA 01/01/2007   HIP PAIN 01/01/2007   LEG LEGNTH DISCREPANCY 01/01/2007    Anette Guarneri, PT, DPT 10/21/21 11:18 AM   [facility] [address] [place], [place], [address] Phone: [phone]   Fax:  [phone]  Name: Sherry Gross MRN: [id number] Date of Birth: [birth year]    PHYSICAL THERAPY DISCHARGE SUMMARY  Visits from Start of Care: 6  Current functional level related to goals / functional outcomes: Unable to assess; pt did not return   Remaining deficits: Unable to assess   Education / Equipment: HEP  Plan: Patient agrees to discharge.  Patient goals were not met. Patient is being discharged due to not returning to PT.     Anette Guarneri, PT, DPT 02/13/23 3:02 PM  [facility] [address] [place], [address] [place], [place] [address] Phone # [phone] Fax # [phone]

## 2021-10-28 ENCOUNTER — Ambulatory Visit: Payer: Managed Care, Other (non HMO) | Admitting: Physical Therapy

## 2021-11-01 ENCOUNTER — Ambulatory Visit: Payer: Managed Care, Other (non HMO) | Admitting: Physical Therapy

## 2021-11-04 ENCOUNTER — Ambulatory Visit: Payer: Managed Care, Other (non HMO) | Admitting: Physical Therapy

## 2021-12-20 ENCOUNTER — Telehealth: Payer: Managed Care, Other (non HMO) | Admitting: Neurology

## 2022-03-09 ENCOUNTER — Encounter: Payer: Self-pay | Admitting: Orthopedic Surgery

## 2022-03-09 DIAGNOSIS — M542 Cervicalgia: Secondary | ICD-10-CM

## 2022-03-10 NOTE — Telephone Encounter (Signed)
Sure thing thx

## 2022-07-17 ENCOUNTER — Other Ambulatory Visit: Payer: Self-pay | Admitting: Urology

## 2022-07-17 DIAGNOSIS — N281 Cyst of kidney, acquired: Secondary | ICD-10-CM

## 2022-08-03 ENCOUNTER — Ambulatory Visit
Admission: RE | Admit: 2022-08-03 | Discharge: 2022-08-03 | Disposition: A | Discharge status: Home / Self Care | Payer: Managed Care, Other (non HMO) | Source: Ambulatory Visit | Attending: Urology | Admitting: Urology

## 2022-08-03 DIAGNOSIS — N281 Cyst of kidney, acquired: Secondary | ICD-10-CM

## 2024-04-24 ENCOUNTER — Encounter: Payer: Self-pay | Admitting: Plastic Surgery

## 2024-04-24 ENCOUNTER — Ambulatory Visit: Admitting: Plastic Surgery

## 2024-04-24 DIAGNOSIS — R2241 Localized swelling, mass and lump, right lower limb: Secondary | ICD-10-CM

## 2024-04-24 DIAGNOSIS — D489 Neoplasm of uncertain behavior, unspecified: Secondary | ICD-10-CM

## 2024-04-24 NOTE — Progress Notes (Signed)
 Referring Provider Harvey Seltzer, MD [address] [place],  [place] [address]   CC:  Chief Complaint  Patient presents with   Consult      Sherry Gross is an 58 y.o. female.  HPI: Sherry Gross is a 58 year old female who is referred for evaluation and management of a soft mass on the inner portion of her right leg at approximately the mid calf position.  This is believed to be a seroma because it has been drained in the past with almost immediate recollection of fluid.  She believes it is due to horseback riding where the leather part of her stirrups rub against her leg.  She has been riding for approximately 8 years but this problem has occurred over the past year to year and a half.  She denies any other significant medical problems and is not on any blood thinners.  She does not smoke.  Allergies  Allergen Reactions   Cephalosporins Itching and Rash   Penicillins Itching and Rash    Outpatient Encounter Medications as of 04/24/2024  Medication Sig   AJOVY 225 MG/1.5ML SOSY INJECT 1.5 ML (225 MG TOTAL) UNDER THE SKIN EVERY 30 (THIRTY) DAYS   Cascara [place] 450 MG CAPS Take 1 capsule by mouth 2 (two) times daily.   Cholecalciferol 25 MCG (1000 UT) tablet Take by mouth.   colchicine 0.6 MG tablet Take by mouth.   famotidine (PEPCID) 20 MG tablet Take 20 mg by mouth daily.   MOTEGRITY 2 MG TABS Take 1 tablet by mouth daily.   Multiple Vitamin (MULTIVITAMIN WITH MINERALS) TABS tablet Take 1 tablet by mouth daily.   omeprazole (PRILOSEC) 20 MG capsule Take 20 mg by mouth 2 (two) times daily before a meal.   OVER THE COUNTER MEDICATION Take 1 capsule by mouth 2 (two) times daily.   OVER THE COUNTER MEDICATION Take 1 tablet by mouth 2 (two) times daily. swiss kris   spironolactone (ALDACTONE) 50 MG tablet Take 50 mg by mouth daily.   SUMAtriptan (IMITREX) 5 MG/ACT nasal spray USE 1 SPRAY ONCE IN 1 NOSTRIL. MAY REPEAT IN 2 HOURS IF HEADACHE PERSISTS. MAX 2 SPRAYS/24 HOURS    Facility-Administered Encounter Medications as of 04/24/2024  Medication   triamcinolone  acetonide (KENALOG ) 10 MG/ML injection 10 mg     Past Medical History:  Diagnosis Date   IBS (irritable bowel syndrome)    Renal cyst     Past Surgical History:  Procedure Laterality Date   LITHOTRIPSY      Family History  Problem Relation Age of Onset   Migraines Neg Hx    Headache Neg Hx    Neuropathy Neg Hx     Social History   Social History Narrative   Not on file     Review of Systems General: Denies fevers, chills, weight loss CV: Denies chest pain, shortness of breath, palpitations Right leg soft nontender mass medial aspect of the leg  Physical Exam    04/24/2024   11:18 AM 09/19/2021    9:21 AM 06/27/2021    3:32 PM  Vitals with BMI  Height  5' 4 5' 4  Weight -- -- 100 lbs  BMI   17.16  Systolic  100   Diastolic  61   Pulse  67     General:  No acute distress,  Alert and oriented, Non-Toxic, Normal speech and affect Right leg: As noted she has a mass that is approximately 8 cm in length 4 cm  in width.  It is soft and fluctuant and is nontender.  There is no overlying erythema and no drainage. Mammogram: Not applicable Assessment/Plan Soft tissue mass: This is most likely a seroma.  I suspect that she has sheared the skin away from the muscle and now has a persistent fluid collection.  We discussed that this will be a difficult problem to address.  I do not believe that an injection of sclerosing agents would be useful for managing the collection.  Prior to attempting surgical treatment I would like to have an MRI to ensure there is no underlying pathology causing the seroma formation.  She does report a possible Baker's cyst and was told that maybe there was a communication between the Baker's cyst and this fluid collection.  I will obtain an MRI of the leg from above the knee to the ankle.  She will follow-up with me after the MRI is done and we will schedule  surgery at that time.  Sherry Gross 04/24/2024, 11:35 AM

## 2024-04-28 ENCOUNTER — Encounter: Payer: Self-pay | Admitting: Sports Medicine

## 2024-05-15 NOTE — Addendum Note (Signed)
 Addended by: Alveta Quintela on: 05/15/2024 12:01 PM   Modules accepted: Orders
